# Patient Record
Sex: Female | Born: 1975 | Race: White | Hispanic: No | Smoking: Never smoker
Health system: Southern US, Community
[De-identification: ages and names within clinical notes are randomized; demographics above are authoritative.]

## PROBLEM LIST (undated history)

## (undated) DIAGNOSIS — E785 Hyperlipidemia, unspecified: Secondary | ICD-10-CM

## (undated) DIAGNOSIS — N390 Urinary tract infection, site not specified: Secondary | ICD-10-CM

## (undated) DIAGNOSIS — I675 Moyamoya disease: Secondary | ICD-10-CM

## (undated) DIAGNOSIS — F32A Depression, unspecified: Secondary | ICD-10-CM

## (undated) DIAGNOSIS — F329 Major depressive disorder, single episode, unspecified: Secondary | ICD-10-CM

## (undated) DIAGNOSIS — I6529 Occlusion and stenosis of unspecified carotid artery: Secondary | ICD-10-CM

## (undated) DIAGNOSIS — I639 Cerebral infarction, unspecified: Secondary | ICD-10-CM

## (undated) DIAGNOSIS — N9489 Other specified conditions associated with female genital organs and menstrual cycle: Principal | ICD-10-CM

## (undated) DIAGNOSIS — F419 Anxiety disorder, unspecified: Secondary | ICD-10-CM

## (undated) HISTORY — DX: Depression, unspecified: F32.A

## (undated) HISTORY — DX: Hyperlipidemia, unspecified: E78.5

## (undated) HISTORY — DX: Other specified conditions associated with female genital organs and menstrual cycle: N94.89

## (undated) HISTORY — DX: Urinary tract infection, site not specified: N39.0

## (undated) HISTORY — DX: Moyamoya disease: I67.5

## (undated) HISTORY — DX: Cerebral infarction, unspecified: I63.9

## (undated) HISTORY — DX: Anxiety disorder, unspecified: F41.9

## (undated) HISTORY — DX: Occlusion and stenosis of unspecified carotid artery: I65.29

## (undated) HISTORY — DX: Major depressive disorder, single episode, unspecified: F32.9

---

## 2000-10-12 ENCOUNTER — Encounter: Payer: Self-pay | Admitting: Emergency Medicine

## 2000-10-12 ENCOUNTER — Emergency Department (HOSPITAL_COMMUNITY): Admission: EM | Admit: 2000-10-12 | Discharge: 2000-10-12 | Payer: Self-pay | Admitting: Emergency Medicine

## 2001-02-27 ENCOUNTER — Other Ambulatory Visit: Admission: RE | Admit: 2001-02-27 | Discharge: 2001-02-27 | Payer: Self-pay | Admitting: *Deleted

## 2002-08-05 ENCOUNTER — Ambulatory Visit (HOSPITAL_COMMUNITY): Admission: RE | Admit: 2002-08-05 | Discharge: 2002-08-05 | Payer: Self-pay | Admitting: *Deleted

## 2002-08-05 ENCOUNTER — Encounter: Payer: Self-pay | Admitting: *Deleted

## 2002-12-31 ENCOUNTER — Inpatient Hospital Stay (HOSPITAL_COMMUNITY): Admission: RE | Admit: 2002-12-31 | Discharge: 2003-01-02 | Payer: Self-pay | Admitting: *Deleted

## 2009-02-05 ENCOUNTER — Ambulatory Visit: Payer: Self-pay | Admitting: Family Medicine

## 2010-08-13 DIAGNOSIS — I639 Cerebral infarction, unspecified: Secondary | ICD-10-CM

## 2010-08-13 HISTORY — DX: Cerebral infarction, unspecified: I63.9

## 2010-10-19 ENCOUNTER — Institutional Professional Consult (permissible substitution): Payer: Self-pay | Admitting: Pulmonary Disease

## 2011-01-01 ENCOUNTER — Inpatient Hospital Stay: Payer: Self-pay | Admitting: Internal Medicine

## 2011-02-28 DIAGNOSIS — I679 Cerebrovascular disease, unspecified: Secondary | ICD-10-CM | POA: Insufficient documentation

## 2011-03-14 HISTORY — PX: BRAIN SURGERY: SHX531

## 2012-03-14 ENCOUNTER — Other Ambulatory Visit: Payer: Self-pay | Admitting: Internal Medicine

## 2012-03-14 NOTE — Telephone Encounter (Signed)
She has not yet been seen in our clinic. Will need to contact previous PCP for refills.

## 2012-03-14 NOTE — Telephone Encounter (Signed)
Patient advised as instructed via telephone, she was really upset.  She stated that her PCP office closed down and she has been to urgent care to get refills but her insurance will not cover them because the refills are from an urgent care.

## 2012-03-14 NOTE — Telephone Encounter (Signed)
Pt called stated that her drug store  [facility] has been calling and faxing for past week  to see if she can get a refill on her meds for the next 21 days until she has her new patient appointment on 8/22  Her pirmary care dr was Krystal Crane who has closed down her practice There were several rx's Pt is competely out of her meds as of today Please advise pt today on what to do

## 2012-04-03 ENCOUNTER — Ambulatory Visit: Payer: Self-pay | Admitting: Internal Medicine

## 2012-04-21 DIAGNOSIS — I69919 Unspecified symptoms and signs involving cognitive functions following unspecified cerebrovascular disease: Secondary | ICD-10-CM | POA: Insufficient documentation

## 2012-09-05 ENCOUNTER — Ambulatory Visit: Payer: Self-pay | Admitting: Bariatrics

## 2012-09-05 LAB — HEPATIC FUNCTION PANEL A (ARMC)
Bilirubin, Direct: <0.1 mg/dL (ref 0.00–0.20)
SGOT(AST): 15 U/L (ref 15–37)
Total Protein: 7.5 g/dL (ref 6.4–8.2)

## 2012-09-05 LAB — PHOSPHORUS: Phosphorus: 3 mg/dL (ref 2.5–4.9)

## 2012-09-05 LAB — HCG, QUANTITATIVE, PREGNANCY: Beta Hcg, Quant.: <1 m[IU]/mL — ABNORMAL LOW

## 2012-09-05 LAB — CBC WITH DIFFERENTIAL/PLATELET
Eosinophil #: 0.2 10*3/uL (ref 0.0–0.7)
Eosinophil %: 2.1 %
Lymphocyte #: 3.5 10*3/uL (ref 1.0–3.6)
Lymphocyte %: 30.5 %
MCHC: 33.5 g/dL (ref 32.0–36.0)
MCV: 86 fL (ref 80–100)
Monocyte %: 7.3 %
Neutrophil %: 59.7 %
RDW: 13.4 % (ref 11.5–14.5)
WBC: 11.4 10*3/uL — ABNORMAL HIGH (ref 3.6–11.0)

## 2012-09-05 LAB — PROTIME-INR: Prothrombin Time: 12.9 secs (ref 11.5–14.7)

## 2012-09-05 LAB — CALCIUM: Calcium, Total: 8.4 mg/dL — ABNORMAL LOW (ref 8.5–10.1)

## 2012-09-08 ENCOUNTER — Ambulatory Visit: Payer: Self-pay | Admitting: Bariatrics

## 2012-09-08 DIAGNOSIS — E782 Mixed hyperlipidemia: Secondary | ICD-10-CM

## 2012-09-10 ENCOUNTER — Ambulatory Visit: Payer: Self-pay

## 2012-09-13 ENCOUNTER — Ambulatory Visit: Payer: Self-pay | Admitting: Bariatrics

## 2012-10-01 ENCOUNTER — Other Ambulatory Visit: Payer: Self-pay | Admitting: Bariatrics

## 2012-10-01 LAB — COMPREHENSIVE METABOLIC PANEL
Albumin: 3.7 g/dL (ref 3.4–5.0)
BUN: 14 mg/dL (ref 7–18)
Bilirubin,Total: 0.4 mg/dL (ref 0.2–1.0)
Calcium, Total: 8.7 mg/dL (ref 8.5–10.1)
Chloride: 107 mmol/L (ref 98–107)
Co2: 25 mmol/L (ref 21–32)
Glucose: 90 mg/dL (ref 65–99)
Osmolality: 279 (ref 275–301)
Potassium: 4.1 mmol/L (ref 3.5–5.1)
SGOT(AST): 16 U/L (ref 15–37)
SGPT (ALT): 28 U/L (ref 12–78)
Sodium: 140 mmol/L (ref 136–145)

## 2012-10-01 LAB — IRON AND TIBC
Iron Bind.Cap.(Total): 309 ug/dL (ref 250–450)
Iron Saturation: 20 %

## 2012-10-01 LAB — FERRITIN: Ferritin (ARMC): 27 ng/mL (ref 8–388)

## 2012-12-30 HISTORY — PX: BARIATRIC SURGERY: SHX1103

## 2013-08-13 HISTORY — PX: CHOLECYSTECTOMY: SHX55

## 2014-01-06 ENCOUNTER — Ambulatory Visit: Payer: Self-pay | Admitting: Family Medicine

## 2014-01-25 DIAGNOSIS — K8020 Calculus of gallbladder without cholecystitis without obstruction: Secondary | ICD-10-CM | POA: Insufficient documentation

## 2014-02-27 ENCOUNTER — Emergency Department: Payer: Self-pay | Admitting: Emergency Medicine

## 2014-02-27 LAB — CBC
HCT: 39.8 % (ref 35.0–47.0)
HGB: 13.3 g/dL (ref 12.0–16.0)
MCH: 29.8 pg (ref 26.0–34.0)
MCHC: 33.4 g/dL (ref 32.0–36.0)
MCV: 89 fL (ref 80–100)
Platelet: 208 10*3/uL (ref 150–440)
RBC: 4.46 10*6/uL (ref 3.80–5.20)
RDW: 12.9 % (ref 11.5–14.5)
WBC: 8.2 10*3/uL (ref 3.6–11.0)

## 2014-02-27 LAB — COMPREHENSIVE METABOLIC PANEL
ALBUMIN: 3.6 g/dL (ref 3.4–5.0)
ALK PHOS: 95 U/L
ANION GAP: 6 — AB (ref 7–16)
AST: 20 U/L (ref 15–37)
BUN: 10 mg/dL (ref 7–18)
Bilirubin,Total: 0.6 mg/dL (ref 0.2–1.0)
CALCIUM: 8.2 mg/dL — AB (ref 8.5–10.1)
Chloride: 107 mmol/L (ref 98–107)
Co2: 28 mmol/L (ref 21–32)
Creatinine: 0.67 mg/dL (ref 0.60–1.30)
GLUCOSE: 75 mg/dL (ref 65–99)
Osmolality: 279 (ref 275–301)
Potassium: 3.5 mmol/L (ref 3.5–5.1)
SGPT (ALT): 21 U/L (ref 12–78)
Sodium: 141 mmol/L (ref 136–145)
TOTAL PROTEIN: 7.1 g/dL (ref 6.4–8.2)

## 2014-02-27 LAB — TROPONIN I: Troponin-I: <0.02 ng/mL

## 2014-03-03 ENCOUNTER — Emergency Department: Payer: Self-pay | Admitting: Internal Medicine

## 2014-03-03 LAB — COMPREHENSIVE METABOLIC PANEL
ANION GAP: 6 — AB (ref 7–16)
Albumin: 3.7 g/dL (ref 3.4–5.0)
Alkaline Phosphatase: 101 U/L
BUN: 9 mg/dL (ref 7–18)
Bilirubin,Total: 0.4 mg/dL (ref 0.2–1.0)
CO2: 29 mmol/L (ref 21–32)
CREATININE: 0.76 mg/dL (ref 0.60–1.30)
Calcium, Total: 8.4 mg/dL — ABNORMAL LOW (ref 8.5–10.1)
Chloride: 108 mmol/L — ABNORMAL HIGH (ref 98–107)
EGFR (African American): >60
EGFR (Non-African Amer.): >60
Glucose: 86 mg/dL (ref 65–99)
Osmolality: 283 (ref 275–301)
POTASSIUM: 3.7 mmol/L (ref 3.5–5.1)
SGOT(AST): 22 U/L (ref 15–37)
SGPT (ALT): 21 U/L
Sodium: 143 mmol/L (ref 136–145)
Total Protein: 7.2 g/dL (ref 6.4–8.2)

## 2014-03-03 LAB — CBC WITH DIFFERENTIAL/PLATELET
BASOS ABS: 0 10*3/uL (ref 0.0–0.1)
BASOS PCT: 0.5 %
EOS ABS: 0.2 10*3/uL (ref 0.0–0.7)
Eosinophil %: 2.1 %
HCT: 39.4 % (ref 35.0–47.0)
HGB: 13.3 g/dL (ref 12.0–16.0)
LYMPHS ABS: 2.4 10*3/uL (ref 1.0–3.6)
Lymphocyte %: 26.7 %
MCH: 30.1 pg (ref 26.0–34.0)
MCHC: 33.9 g/dL (ref 32.0–36.0)
MCV: 89 fL (ref 80–100)
Monocyte #: 0.6 x10 3/mm (ref 0.2–0.9)
Monocyte %: 7 %
Neutrophil #: 5.7 10*3/uL (ref 1.4–6.5)
Neutrophil %: 63.7 %
Platelet: 233 10*3/uL (ref 150–440)
RBC: 4.43 10*6/uL (ref 3.80–5.20)
RDW: 13.1 % (ref 11.5–14.5)
WBC: 8.9 10*3/uL (ref 3.6–11.0)

## 2014-03-03 LAB — URINALYSIS, COMPLETE
BACTERIA: NONE SEEN
Bilirubin,UR: NEGATIVE
Glucose,UR: NEGATIVE mg/dL (ref 0–75)
Ketone: NEGATIVE
Nitrite: NEGATIVE
PH: 5 (ref 4.5–8.0)
Protein: NEGATIVE
RBC,UR: 1 /HPF (ref 0–5)
SPECIFIC GRAVITY: 1.018 (ref 1.003–1.030)
Squamous Epithelial: 1

## 2014-03-03 LAB — PREGNANCY, URINE: PREGNANCY TEST, URINE: NEGATIVE m[IU]/mL

## 2014-04-06 ENCOUNTER — Telehealth: Payer: Self-pay | Admitting: *Deleted

## 2014-04-06 NOTE — Telephone Encounter (Addendum)
Pt called and is asking for second opinion with our office.  Had stroke 3 yrs ago, had brain surgery MCA x 2 and having bad headaches.

## 2014-07-30 NOTE — Telephone Encounter (Signed)
Patient will follow the referral process to be seen in our office.

## 2015-01-24 ENCOUNTER — Other Ambulatory Visit: Payer: Self-pay

## 2015-01-24 ENCOUNTER — Telehealth: Payer: Self-pay | Admitting: Family Medicine

## 2015-01-24 DIAGNOSIS — F32A Depression, unspecified: Secondary | ICD-10-CM

## 2015-01-24 DIAGNOSIS — F329 Major depressive disorder, single episode, unspecified: Secondary | ICD-10-CM

## 2015-01-24 DIAGNOSIS — F419 Anxiety disorder, unspecified: Principal | ICD-10-CM

## 2015-01-24 MED ORDER — VENLAFAXINE HCL ER 75 MG PO CP24: 225.0000 mg | ORAL_CAPSULE | Freq: Every day | ORAL | Status: DC

## 2015-01-24 NOTE — Telephone Encounter (Signed)
Lmom to inform pt about her refill

## 2015-01-24 NOTE — Telephone Encounter (Signed)
Received a fax from [facility] requesting that the quantitiy be changed to a 90 day supply so that insurance would cover it.

## 2015-01-24 NOTE — Telephone Encounter (Signed)
Done

## 2015-01-25 ENCOUNTER — Other Ambulatory Visit: Payer: Self-pay

## 2015-01-25 DIAGNOSIS — F329 Major depressive disorder, single episode, unspecified: Secondary | ICD-10-CM

## 2015-01-25 DIAGNOSIS — F419 Anxiety disorder, unspecified: Principal | ICD-10-CM

## 2015-01-25 DIAGNOSIS — F32A Depression, unspecified: Secondary | ICD-10-CM

## 2015-01-26 MED ORDER — VENLAFAXINE HCL ER 75 MG PO CP24: 225.0000 mg | ORAL_CAPSULE | Freq: Every day | ORAL | Status: DC

## 2015-04-15 ENCOUNTER — Ambulatory Visit: Payer: Self-pay | Admitting: Family Medicine

## 2015-04-20 ENCOUNTER — Other Ambulatory Visit: Payer: Self-pay | Admitting: Family Medicine

## 2015-04-20 ENCOUNTER — Encounter: Payer: Self-pay | Admitting: Family Medicine

## 2015-04-20 ENCOUNTER — Ambulatory Visit (INDEPENDENT_AMBULATORY_CARE_PROVIDER_SITE_OTHER): Payer: BLUE CROSS/BLUE SHIELD | Admitting: Family Medicine

## 2015-04-20 VITALS — BP 104/60 | HR 121 | Temp 97.6°F | Resp 18 | Ht 63.0 in | Wt 152.7 lb

## 2015-04-20 DIAGNOSIS — N390 Urinary tract infection, site not specified: Secondary | ICD-10-CM

## 2015-04-20 DIAGNOSIS — F3341 Major depressive disorder, recurrent, in partial remission: Secondary | ICD-10-CM | POA: Insufficient documentation

## 2015-04-20 DIAGNOSIS — R2232 Localized swelling, mass and lump, left upper limb: Secondary | ICD-10-CM | POA: Diagnosis not present

## 2015-04-20 DIAGNOSIS — R319 Hematuria, unspecified: Secondary | ICD-10-CM

## 2015-04-20 DIAGNOSIS — G44209 Tension-type headache, unspecified, not intractable: Secondary | ICD-10-CM | POA: Insufficient documentation

## 2015-04-20 DIAGNOSIS — I639 Cerebral infarction, unspecified: Secondary | ICD-10-CM | POA: Insufficient documentation

## 2015-04-20 DIAGNOSIS — F418 Other specified anxiety disorders: Secondary | ICD-10-CM

## 2015-04-20 DIAGNOSIS — K8020 Calculus of gallbladder without cholecystitis without obstruction: Secondary | ICD-10-CM | POA: Insufficient documentation

## 2015-04-20 DIAGNOSIS — Z87898 Personal history of other specified conditions: Secondary | ICD-10-CM | POA: Insufficient documentation

## 2015-04-20 DIAGNOSIS — R109 Unspecified abdominal pain: Secondary | ICD-10-CM | POA: Insufficient documentation

## 2015-04-20 DIAGNOSIS — Z789 Other specified health status: Secondary | ICD-10-CM | POA: Insufficient documentation

## 2015-04-20 DIAGNOSIS — I651 Occlusion and stenosis of basilar artery: Secondary | ICD-10-CM | POA: Insufficient documentation

## 2015-04-20 DIAGNOSIS — E785 Hyperlipidemia, unspecified: Secondary | ICD-10-CM | POA: Insufficient documentation

## 2015-04-20 DIAGNOSIS — R066 Hiccough: Secondary | ICD-10-CM | POA: Insufficient documentation

## 2015-04-20 LAB — POCT URINALYSIS DIPSTICK
Bilirubin, UA: NEGATIVE
GLUCOSE UA: NEGATIVE
Ketones, UA: NEGATIVE
NITRITE UA: POSITIVE
PROTEIN UA: NEGATIVE
Spec Grav, UA: 1.02
UROBILINOGEN UA: 0.2
pH, UA: 6.5

## 2015-04-20 MED ORDER — CIPROFLOXACIN HCL 500 MG PO TABS: 500.0000 mg | ORAL_TABLET | Freq: Two times a day (BID) | ORAL | Status: DC

## 2015-04-20 NOTE — Patient Instructions (Signed)

## 2015-04-20 NOTE — Addendum Note (Signed)
Addended by: Edwena Felty on: 04/20/2015 01:22 PM   Modules accepted: Kipp Brood

## 2015-04-20 NOTE — Progress Notes (Signed)
Name: Krystal Crane   MRN: [id number]    DOB: October 26, 1975   Date:04/20/2015       Progress Note  Subjective  Chief Complaint  Chief Complaint  Patient presents with  . Urinary Tract Infection    patient stated that her symptoms started about 2 weeks ago.  . Finger Injury    patient had a knot on her left ring finger that is now swelling, but no pain.    HPI  Patient is here today with concerns regarding the following symptoms abnormal smelling urine, foul smelling urine, frequency, hesitancy and incomplete bladder emptying that started 2 weeks ago after having sexual intercourse with husband.  Associated with no other symptoms.   No problem-specific assessment & plan notes found for this encounter.   History reviewed. No pertinent past medical history.  Social History  Substance Use Topics  . Smoking status: Never Smoker   . Smokeless tobacco: Not on file  . Alcohol Use: 0.0 oz/week    0 Standard drinks or equivalent per week     Comment: social     Current outpatient prescriptions:  .  aspirin EC 325 MG tablet, Take 325 mg by mouth., Disp: , Rfl:  .  FLUoxetine (PROZAC) 40 MG capsule, Take 2 capsules each day, Disp: , Rfl:  .  venlafaxine (EFFEXOR) 100 MG tablet, Take 300 mg by mouth., Disp: , Rfl:  .  buPROPion (WELLBUTRIN SR) 150 MG 12 hr tablet, Take 150 mg by mouth 2 (two) times daily., Disp: , Rfl:  .  clonazePAM (KLONOPIN) 0.5 MG tablet, Take 0.5 mg by mouth 2 (two) times daily as needed., Disp: , Rfl:  .  rosuvastatin (CRESTOR) 20 MG tablet, Take 20 mg by mouth daily., Disp: , Rfl:  .  Topiramate ER (QUDEXY XR) 150 MG CS24, Take by mouth., Disp: , Rfl:  .  venlafaxine XR (EFFEXOR-XR) 75 MG 24 hr capsule, Take 3 capsules (225 mg total) by mouth daily with breakfast., Disp: 270 capsule, Rfl: 1  Allergies  Allergen Reactions  . Levetiracetam Hives    ROS  10 Systems reviewed and is negative except as mentioned in HPI.   Objective  Filed Vitals:   04/20/15  1138  BP: 104/60  Pulse: 121  Temp: 97.6 F (36.4 C)  TempSrc: Oral  Resp: 18  Height:  (1.6 m)  Weight: 152 lb 11.2 oz (69.264 kg)  SpO2: 97%   Body mass index is 27.06 kg/(m^2).   Physical Exam  Constitutional: Patient appears well-developed and well-nourished. In no acute distress but does appear to be uncomfortable from acute illness. Cardiovascular: Normal rate, regular rhythm and normal heart sounds.  No murmur heard.  Pulmonary/Chest: Effort normal and breath sounds normal. No respiratory distress. Abdomen: Soft with normal bowel sounds, mild tenderness on deep palpation over suprapubic area, no reproducible flank tenderness bilaterally.  Genitourinary: Exam deferred. Skin: Skin is warm and dry. No rash noted. No erythema.  Psychiatric: Patient has a normal mood and affect. Behavior is normal in office today. Judgment and thought content normal in office today.   Assessment & Plan  Symptoms suggestive of uncomplicated urinary tract infection. Instructed patient on increasing hydration with water and ways to prevent future UTIs. May use Azo for symptomatic relief if not already doing so but not recommended to be used beyond 2-3 days. May start antibiotic therapy.   The patient has been counseled on the proper use, side effects and potential interactions of the new medication. Patient  encouraged to review the side effects and safety profile pamphlet provided with the prescription from the pharmacy as well as request counseling from the pharmacy team as needed.

## 2015-04-20 NOTE — Progress Notes (Signed)
Name: Krystal Crane   MRN: [id number]    DOB: October 05, 1975   Date:04/20/2015       Progress Note  Subjective  Chief Complaint  Chief Complaint  Patient presents with  . Urinary Tract Infection    patient stated that her symptoms started about 2 weeks ago.  . Finger Injury    patient had a knot on her left ring finger that is now swelling, but no pain.    HPI  Patient is here today with concerns regarding the following symptoms abnormal smelling urine, burning with urination, dysuria, frequency, hesitancy, suprapubic pressure and urgency that started 2 weeks ago.  Associated with fatigue. Not associated with fevers, nausea, vomiting. Is having some right flank discomfort.  She has not tried anything for relief.  Still has knot on palmar surface of left hand 4th digit which has now increased in size with swelling. Still has full ROM and there is no pain. Had to take her rings off. Denies falling onto hand, foreign object in hand, numbness tingling, other lesions or joint involvement.   Active Ambulatory Problems    Diagnosis Date Noted  . Abdominal pain 04/20/2015  . 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 Anxiety and depression 04/20/2015  . Cholelithiasis without obstruction 04/20/2015  . Calculus of gallbladder 01/25/2014  . Diffuse cerebrovascular disease 02/28/2011  . Cephalalgia 04/21/2012  . HLD (hyperlipidemia) 04/20/2015  . Encounter for screening for lipoid disorders 04/20/2015  . 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 Cerebral vascular accident 04/20/2015  . Headache, tension-type 04/20/2015  . Basilar artery stenosis 04/20/2015  . Urinary tract infectious disease 04/20/2015  . Nodule of finger of left hand 04/20/2015   Resolved Ambulatory Problems    Diagnosis Date Noted  . Gravida 4 para 4 04/20/2015  . Parity 3 04/20/2015   No Additional Past Medical History    Social History  Substance Use Topics  . Smoking status: Never Smoker   .  Smokeless tobacco: Not on file  . Alcohol Use: 0.0 oz/week    0 Standard drinks or equivalent per week     Comment: social     Current outpatient prescriptions:  .  aspirin EC 325 MG tablet, Take 325 mg by mouth., Disp: , Rfl:  .  FLUoxetine (PROZAC) 40 MG capsule, Take 2 capsules each day, Disp: , Rfl:  .  venlafaxine (EFFEXOR) 100 MG tablet, Take 300 mg by mouth., Disp: , Rfl:  .  buPROPion (WELLBUTRIN SR) 150 MG 12 hr tablet, Take 150 mg by mouth 2 (two) times daily., Disp: , Rfl:  .  clonazePAM (KLONOPIN) 0.5 MG tablet, Take 0.5 mg by mouth 2 (two) times daily as needed., Disp: , Rfl:  .  rosuvastatin (CRESTOR) 20 MG tablet, Take 20 mg by mouth daily., Disp: , Rfl:  .  Topiramate ER (QUDEXY XR) 150 MG CS24, Take by mouth., Disp: , Rfl:  .  venlafaxine XR (EFFEXOR-XR) 75 MG 24 hr capsule, Take 3 capsules (225 mg total) by mouth daily with breakfast., Disp: 270 capsule, Rfl: 1  Allergies  Allergen Reactions  . Levetiracetam Hives    ROS  CONSTITUTIONAL: No significant weight changes, fever, chills, weakness or fatigue.  HEENT:  - Eyes: No visual changes.  - Ears: No auditory changes. No pain.  - Nose: No sneezing, congestion, runny nose. - Throat: No sore throat. No changes in swallowing. SKIN: No rash or itching.  CARDIOVASCULAR: No chest pain, chest pressure or chest discomfort.  No palpitations or edema.  RESPIRATORY: No shortness of breath, cough or sputum.  GASTROINTESTINAL: No anorexia, nausea, vomiting. No changes in bowel habits. No abdominal pain or blood.  GENITOURINARY: Yes dysuria, frequency, odor to urine. NEUROLOGICAL: No headache, dizziness, syncope, paralysis, ataxia, numbness or tingling in the extremities. No memory changes. No change in bowel or bladder control.  MUSCULOSKELETAL: No joint pain. No muscle pain. Left hand 4th finger joint swelling. HEMATOLOGIC: No anemia, bleeding or bruising.  LYMPHATICS: No enlarged lymph nodes.  PSYCHIATRIC: No change in  mood. No change in sleep pattern.  ENDOCRINOLOGIC: No reports of sweating, cold or heat intolerance. No polyuria or polydipsia.     Objective  Filed Vitals:   04/20/15 1138  BP: 104/60  Pulse: 121  Temp: 97.6 F (36.4 C)  TempSrc: Oral  Resp: 18  Height: 5\' 3"  (1.6 m)  Weight: 152 lb 11.2 oz (69.264 kg)  SpO2: 97%   Body mass index is 27.06 kg/(m^2).   Results for orders placed or performed in visit on 04/20/15 (from the past 24 hour(s))  POCT urinalysis dipstick     Status: Abnormal   Collection Time: 04/20/15 11:53 AM  Result Value Ref Range   Color, UA AMBER    Clarity, UA CLOUDY    Glucose, UA NEGATIVE    Bilirubin, UA NEGATIVE    Ketones, UA NEGATIVE    Spec Grav, UA 1.020    Blood, UA SMALL    pH, UA 6.5    Protein, UA NEGATIVE    Urobilinogen, UA 0.2    Nitrite, UA POSITIVE    Leukocytes, UA moderate (2+) (A) Negative      Physical Exam  Constitutional: Patient appears well-developed and well-nourished. In no distress.  Cardiovascular: Normal rate, regular rhythm and normal heart sounds.  No murmur heard.  Pulmonary/Chest: Effort normal and breath sounds normal. No respiratory distress. Abdomen: Soft with normal bowel sounds, mild tenderness on deep palpation over suprapubic area, no reproducible flank tenderness bilaterally.  Genitourinary: Exam deferred. Musculoskeletal: Normal range of motion bilateral UE and LE. Left hand 4th digit, palmar surface, swelling over PIP joint, non tender, blue hued nodule palpated under skin. Peripheral vascular: Bilateral LE no edema. Neurological: CN II-XII grossly intact with no focal deficits. Alert and oriented to person, place, and time. Coordination, balance, strength, speech and gait are normal.  Skin: Skin is warm and dry. No rash noted. No erythema.  Psychiatric: Patient has a normal mood and affect. Behavior is normal in office today. Judgment and thought content normal in office today.   Assessment &  Plan  1. Urinary tract infection with hematuria, site unspecified Symptoms suggestive of uncomplicated urinary tract infection. Instructed patient on increasing hydration with water and ways to prevent future UTIs. May use Azo for symptomatic relief if not already doing so but not recommended to be used beyond 2-3 days. May start antibiotic therapy. I will send urine specimen for culture and STD testing.  The patient has been counseled on the proper use, side effects and potential interactions of the new medication. Patient encouraged to review the side effects and safety profile pamphlet provided with the prescription from the pharmacy as well as request counseling from the pharmacy team as needed.   - POCT urinalysis dipstick - GC/chlamydia probe amp, urine - Urine Culture - ciprofloxacin (CIPRO) 500 MG tablet; Take 1 tablet (500 mg total) by mouth 2 (two) times daily.  Dispense: 20 tablet; Refill: 0  2. Nodule of finger of  left hand Progressive swelling will consult ortho.   - Ambulatory referral to Orthopedic Surgery

## 2015-04-22 LAB — URINE CULTURE

## 2015-04-22 LAB — CHLAMYDIA/GONOCOCCUS/TRICHOMONAS, NAA
CHLAMYDIA BY NAA: NEGATIVE
GONOCOCCUS BY NAA: NEGATIVE
TRICH VAG BY NAA: NEGATIVE

## 2015-05-02 ENCOUNTER — Other Ambulatory Visit: Payer: Self-pay | Admitting: Family Medicine

## 2015-05-02 NOTE — Telephone Encounter (Signed)
Her urine culture grew E. Coli sensitive the the antibiotic she was prescribed, ciprofloxacin. You may have to call [facility] about the GC/Chlamydia testing because they never send me results.

## 2015-05-02 NOTE — Telephone Encounter (Signed)
Patient is checking status on her urinalysis results

## 2015-05-02 NOTE — Telephone Encounter (Signed)
Patient was informed of results and she stated she has completed the full course of atb today.

## 2015-05-03 ENCOUNTER — Other Ambulatory Visit: Payer: Self-pay

## 2015-05-03 MED ORDER — CLONAZEPAM 0.5 MG PO TABS: 0.5000 mg | ORAL_TABLET | Freq: Two times a day (BID) | ORAL | Status: DC | PRN

## 2015-05-03 NOTE — Telephone Encounter (Signed)
Refill request was sent to Dr. Ashany Sundaram for approval and submission.  

## 2015-05-09 ENCOUNTER — Telehealth: Payer: Self-pay

## 2015-05-09 NOTE — Telephone Encounter (Signed)
Patient came in to pick up her Rx for Klonopin, but stated that Dr. Sherley Bounds did not give her the right quantity. She stated that she takes it twice a everday and that #30 was not going to be enough. Patient was told and shown that it was written on the rx stating "NOT INTENDED TO BE USED EVERY DAY," but she stated it was never discussed about her coming down or off of it and she can't. She stated that she has been w/o her meds for 3 days now and could not get this one filled b/c if she did her insurance would not cover the right rx so she will hold off until I hear back from Dr. Sherley Bounds. I informed her that I would send her a message and would give her a call back.

## 2015-05-10 ENCOUNTER — Other Ambulatory Visit: Payer: Self-pay | Admitting: Family Medicine

## 2015-05-10 DIAGNOSIS — F1320 Sedative, hypnotic or anxiolytic dependence, uncomplicated: Secondary | ICD-10-CM

## 2015-05-10 MED ORDER — CLONAZEPAM 0.5 MG PO TABS: 0.5000 mg | ORAL_TABLET | Freq: Two times a day (BID) | ORAL | Status: DC

## 2015-05-10 NOTE — Telephone Encounter (Signed)
Printed new Rx quantity #60 for 30 day supply AND added new diagnosis to her medical records: Benzodiazepine Dependency.

## 2015-07-18 ENCOUNTER — Ambulatory Visit (INDEPENDENT_AMBULATORY_CARE_PROVIDER_SITE_OTHER): Payer: BLUE CROSS/BLUE SHIELD | Admitting: Family Medicine

## 2015-07-18 ENCOUNTER — Encounter: Payer: Self-pay | Admitting: Family Medicine

## 2015-07-18 VITALS — BP 102/68 | HR 109 | Temp 97.6°F | Resp 16 | Wt 163.2 lb

## 2015-07-18 DIAGNOSIS — F418 Other specified anxiety disorders: Principal | ICD-10-CM

## 2015-07-18 DIAGNOSIS — F3341 Major depressive disorder, recurrent, in partial remission: Secondary | ICD-10-CM

## 2015-07-18 DIAGNOSIS — I675 Moyamoya disease: Secondary | ICD-10-CM | POA: Diagnosis not present

## 2015-07-18 NOTE — Progress Notes (Signed)
Name: Krystal Crane   MRN: [id number]    DOB: 04-24-1976   Date:07/18/2015       Progress Note  Subjective  Chief Complaint  Chief Complaint  Patient presents with  . Medication Management    patient is not sure if her medication needs to be changed or if she needs to be referred to a counsellor.    HPI  Krystal Crane is a 39 year old female here today to discuss her long standing depression and anxiety. Accompanied by husband today. Pertinent history of Moya-Moya disease, with cognitive memory issues. Follow by [facility], although provider for Neurology will change soon she reports. Patient voices poor sleep, ongoing headache issues despite going to headache specialist for nearly a year now. Tried establishing with psychiatrist earlier this year but was late to her appointment and they would not reschedule her. Husband states she has poor sleep, 3-4 hrs a night, fatigued during the day, unmotivated, not helping around the house or with kids. He has come to her visit today as he is concerned about [name] and reports much strain on her marriage due to her depression.   If you may recall [name] has tried Prozac  for 15 years but efficacy wore off so they switched to Brintelix  which initially worked but then effects wore off so dose increased to  dose and it gave her more headaches. Also having sexual side effects to Brintelix. Switched to Venlafaxine with daily use of clonazepam and added on Wellbutrin.   In addition headache specialists have added Topiramate, hydroxyzine, cambia, lyrica, phenergan, reglan.   Patient Active Problem List   Diagnosis Date Noted  . 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 Benzodiazepine dependence, continuous (HCC) 05/10/2015  . Major depressive disorder, recurrent episode, in partial remission with anxious distress (HCC) 04/20/2015  . Cholelithiasis without obstruction 04/20/2015  . HLD (hyperlipidemia) 04/20/2015  .  Encounter for screening for lipoid disorders 04/20/2015  . 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 Cerebral vascular accident (HCC) 04/20/2015  . Headache, tension-type 04/20/2015  . Basilar artery stenosis 04/20/2015  . Nodule of finger of left hand 04/20/2015  . Calculus of gallbladder 01/25/2014  . 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 Cephalalgia 04/21/2012  . Diffuse cerebrovascular disease 02/28/2011    Social History  Substance Use Topics  . Smoking status: Never Smoker   . Smokeless tobacco: Not on file  . Alcohol Use: 0.0 oz/week    0 Standard drinks or equivalent per week     Comment: social     Current outpatient prescriptions:  .  aspirin EC 325 MG tablet, Take 325 mg by mouth., Disp: , Rfl:  .  buPROPion (WELLBUTRIN SR) 150 MG 12 hr tablet, Take 150 mg by mouth 2 (two) times daily., Disp: , Rfl:  .  chlorzoxazone (PARAFON) 500 MG tablet, Take 1 tablet by mouth 4 (four) times daily as needed., Disp: , Rfl:  .  clonazePAM (KLONOPIN) 0.5 MG tablet, Take 1 tablet (0.5 mg total) by mouth 2 (two) times daily., Disp: 60 tablet, Rfl: 5 .  Diclofenac Potassium (CAMBIA) 50 MG PACK, , Disp: , Rfl:  .  hydrOXYzine (ATARAX/VISTARIL) 10 MG tablet, Take 10 mg by mouth 3 (three) times daily as needed., Disp: , Rfl:  .  metoCLOPramide (REGLAN) 10 MG tablet, Take 10 mg by mouth 4 (four) times daily., Disp: , Rfl:  .  pregabalin (LYRICA) 50 MG capsule, Take 50 mg by mouth  3 (three) times daily. Work up to 6 tabs a day, Disp: , Rfl:  .  promethazine (PHENERGAN) 25 MG tablet, Take 25 mg by mouth every 6 (six) hours as needed., Disp: , Rfl:  .  Topiramate ER (QUDEXY XR) 150 MG CS24, Take by mouth., Disp: , Rfl:  .  venlafaxine XR (EFFEXOR-XR) 75 MG 24 hr capsule, TAKE 3 CAPSULES BY MOUTH DAILY WITH BREAKFAST, Disp: , Rfl: 1  Past Surgical History  Procedure Laterality Date  . Cesarean section      3  . Brain surgery      bypass surgery    Family History   Problem Relation Age of Onset  . Hypertension Mother   . Stroke Mother   . Hyperlipidemia Father   . Hypertension Father     Allergies  Allergen Reactions  . Levetiracetam Hives  . Ondansetron Nausea And Vomiting     Review of Systems  CONSTITUTIONAL: No significant weight changes, fever, chills, weakness or fatigue.  CARDIOVASCULAR: No chest pain, chest pressure or chest discomfort. No palpitations or edema.  RESPIRATORY: No shortness of breath, cough or sputum.  NEUROLOGICAL: No headache, dizziness, syncope, paralysis, ataxia, numbness or tingling in the extremities. No memory changes. No change in bowel or bladder control.  PSYCHIATRIC: Yes change in mood. No change in sleep pattern.  ENDOCRINOLOGIC: No reports of sweating, cold or heat intolerance. No polyuria or polydipsia.    Depression screen [facility] 07/18/2015 04/20/2015  Decreased Interest 0 0  Down, Depressed, Hopeless 2 1  PHQ - 2 Score 2 1  Altered sleeping 3 -  Tired, decreased energy 3 -  Change in appetite 1 -  Feeling bad or failure about yourself  1 -  Trouble concentrating 2 -  Moving slowly or fidgety/restless 1 -  Suicidal thoughts 0 -  PHQ-9 Score 13 -     Objective  BP 102/68 mmHg  Pulse 109  Temp(Src) 97.6 F (36.4 C) (Oral)  Resp 16  Wt 163 lb 3.2 oz (74.027 kg)  SpO2 97%  LMP 07/06/2015 (Approximate) Body mass index is 28.92 kg/(m^2).  Physical Exam  Constitutional: Patient appears well-developed and well-nourished. In no distress.  Cardiovascular: Normal rate, regular rhythm and normal heart sounds.  No murmur heard.  Pulmonary/Chest: Effort normal and breath sounds normal. No respiratory distress. Musculoskeletal: Normal range of motion bilateral UE and LE, no joint effusions. Peripheral vascular: Bilateral LE no edema. Neurological: CN II-XII grossly intact with no focal deficits. Alert and oriented to person, place, and time. Coordination, balance, strength, speech and gait are  normal.  Skin: Skin is warm and dry. No rash noted. No erythema.  Psychiatric: Patient has a sad mood and affect. Behavior is normal in office today. Judgment and thought content normal in office today.   Assessment & Plan  1. Major depressive disorder, recurrent episode, in partial remission with anxious distress (HCC) Sub optimal control. I would like for Nicholaus BloomKelley to consult with a psychologist and recommend counseling, individual and marriage.   - Ambulatory referral to Psychiatry  2. Moya-moya disease May play a role in depressive moods.  - Ambulatory referral to Psychiatry

## 2015-07-20 ENCOUNTER — Telehealth: Payer: Self-pay | Admitting: Family Medicine

## 2015-07-20 NOTE — Telephone Encounter (Signed)
Patient has been referred to Dr. Lynett FishSu Hansen and has been approved for 6 visits, but I do not know if he has experience with brain injuries.

## 2015-07-20 NOTE — Telephone Encounter (Signed)
Pt is waiting on a referral to psychiatry. Pt is requesting that she be referred to a psychiatrist that has experience with brain injuries.

## 2015-07-20 NOTE — Telephone Encounter (Signed)
Keep appointment with Dr. Lynett FishSu Hansen. Although there may be a correlation to her previous brain injury, depression is treated the same way regardless.

## 2015-07-20 NOTE — Telephone Encounter (Signed)
Contacted this patient to inform her that [facility] to address all of her needs, but there was no answer. A message was left for her to give us a call if she had any additional needs.

## 2015-07-29 ENCOUNTER — Other Ambulatory Visit: Payer: Self-pay | Admitting: Family Medicine

## 2015-07-30 LAB — HM MAMMOGRAPHY: HM MAMMO: NORMAL (ref 0–4)

## 2015-08-18 ENCOUNTER — Telehealth: Payer: Self-pay

## 2015-08-24 NOTE — Telephone Encounter (Signed)
erro  neous encounter

## 2015-09-08 ENCOUNTER — Other Ambulatory Visit: Payer: Self-pay | Admitting: Family Medicine

## 2015-12-02 ENCOUNTER — Other Ambulatory Visit: Payer: Self-pay

## 2015-12-02 MED ORDER — CLONAZEPAM 0.5 MG PO TABS: 0.5000 mg | ORAL_TABLET | Freq: Two times a day (BID) | ORAL | Status: DC

## 2015-12-02 NOTE — Telephone Encounter (Signed)
Is currently not seeing psych.

## 2015-12-02 NOTE — Telephone Encounter (Signed)
Patient would need a visit to discuss this medicine and get Rx I see that Dr. Sherley BoundsSundaram referred her to Dr. Janeece RiggersSu anyway; please ask her to get this medicine from her psychiatrist

## 2015-12-02 NOTE — Telephone Encounter (Signed)
Limited rx ready to fax; will see her 4/26

## 2015-12-02 NOTE — Telephone Encounter (Signed)
Made an appt for next week 4/26 wants to see about going ahead and getting refill

## 2015-12-06 ENCOUNTER — Other Ambulatory Visit: Payer: Self-pay

## 2015-12-07 ENCOUNTER — Ambulatory Visit: Payer: BLUE CROSS/BLUE SHIELD | Admitting: Family Medicine

## 2015-12-09 ENCOUNTER — Encounter: Payer: Self-pay | Admitting: Family Medicine

## 2015-12-09 ENCOUNTER — Ambulatory Visit (INDEPENDENT_AMBULATORY_CARE_PROVIDER_SITE_OTHER): Payer: BLUE CROSS/BLUE SHIELD | Admitting: Family Medicine

## 2015-12-09 VITALS — BP 104/68 | HR 88 | Temp 97.9°F | Resp 14 | Ht 63.0 in | Wt 163.0 lb

## 2015-12-09 DIAGNOSIS — R8290 Unspecified abnormal findings in urine: Secondary | ICD-10-CM

## 2015-12-09 DIAGNOSIS — F418 Other specified anxiety disorders: Secondary | ICD-10-CM

## 2015-12-09 DIAGNOSIS — F1320 Sedative, hypnotic or anxiolytic dependence, uncomplicated: Secondary | ICD-10-CM

## 2015-12-09 DIAGNOSIS — N390 Urinary tract infection, site not specified: Secondary | ICD-10-CM

## 2015-12-09 DIAGNOSIS — F3341 Major depressive disorder, recurrent, in partial remission: Secondary | ICD-10-CM

## 2015-12-09 HISTORY — DX: Urinary tract infection, site not specified: N39.0

## 2015-12-09 LAB — POCT URINALYSIS DIPSTICK
Bilirubin, UA: NEGATIVE
Blood, UA: NEGATIVE
GLUCOSE UA: NEGATIVE
KETONES UA: NEGATIVE
NITRITE UA: POSITIVE
Protein, UA: NEGATIVE
SPEC GRAV UA: 1.02
Urobilinogen, UA: 0.2
pH, UA: 6

## 2015-12-09 MED ORDER — CLONAZEPAM 0.5 MG PO TABS: ORAL_TABLET | ORAL | Status: DC

## 2015-12-09 MED ORDER — NITROFURANTOIN MONOHYD MACRO 100 MG PO CAPS: 100.0000 mg | ORAL_CAPSULE | Freq: Two times a day (BID) | ORAL | Status: DC

## 2015-12-09 NOTE — Patient Instructions (Signed)
Consider drinking cranberry juice or taking a cranberry pill daily for prevention Hydration is important and voiding after intimacy Start the antibiotic today We'll see what the culture shows and get you an antibiotic to take after intimacy Never ever take any alcohol or pain medicine or sleeping pills with the clonazepam Taper that by one-half of a pill each week until you come off Consider seeing a counselor

## 2015-12-09 NOTE — Progress Notes (Signed)
BP 104/68 mmHg  Pulse 88  Temp(Src) 97.9 F (36.6 C) (Oral)  Resp 14  Ht 5\' 3"  (1.6 m)  Wt 163 lb (73.936 kg)  BMI 28.88 kg/m2  SpO2 95%  LMP 11/21/2015 (Approximate)   Subjective:    Patient ID: Krystal Crane, female    DOB: 07-Mar-1976, 40 y.o.   MRN: [id number]  HPI: Krystal Crane is a 40 y.o. female  Chief Complaint  Patient presents with  . Medication Refill  . Urinary Tract Infection    foul odor from urine   Patient thinks she has a urinary tract infection; urine smells foul; gets infections nearly every single time she has had intercourse over the last 5-6 times; never had UTI's before the last year or so, actually 7 months ago; reviewed urine and it was positive; today's urine reviewed; no fevers; does have some back pain, more so lately and taking aleve; lower abdominal pain; periods are regular; no fam hx of frequent UTIs; has IUD in place, will [facility]   Migraines; on lyrica and qudexy; chlorzoxazone PRN, reglan PRN for migraines; getting them pretty much daily; has not botox injections; [facility] [place]  Aspirin daily  effexor 225 mg; clonazepam daily, but has been out for 9 days; did not have any seizures, but did have nervous twitches; uses for anxiety; also takes wellbutrin to counter-act sexual side effects of the effexor; mother has stage 4 liver disease, terminally ill and just hanging on day to day  [facility] 12/09/2015 07/18/2015 04/20/2015  Decreased Interest 0 0 0  Down, Depressed, Hopeless 0 2 1  PHQ - 2 Score 0 2 1  Altered sleeping - 3 -  Tired, decreased energy - 3 -  Change in appetite - 1 -  Feeling bad or failure about yourself  - 1 -  Trouble concentrating - 2 -  Moving slowly or fidgety/restless - 1 -  Suicidal thoughts - 0 -  PHQ-9 Score - 13 -   Relevant past medical, surgical, family and social history reviewed and updated as indicated. Interim  medical history since last visit reviewed. Allergies and medications reviewed and updated.  Review of Systems Per HPI unless specifically indicated above     Objective:    BP 104/68 mmHg  Pulse 88  Temp(Src) 97.9 F (36.6 C) (Oral)  Resp 14  Ht 5\' 3"  (1.6 m)  Wt 163 lb (73.936 kg)  BMI 28.88 kg/m2  SpO2 95%  LMP 11/21/2015 (Approximate)  Wt Readings from Last 3 Encounters:  12/09/15 163 lb (73.936 kg)  07/18/15 163 lb 3.2 oz (74.027 kg)  04/20/15 152 lb 11.2 oz (69.264 kg)    Physical Exam  Constitutional: She appears well-developed and well-nourished. No distress.  Eyes: No scleral icterus.  Cardiovascular: Normal rate and regular rhythm.   Pulmonary/Chest: Effort normal and breath sounds normal.  Abdominal: She exhibits no distension. There is no tenderness. There is no guarding and no CVA tenderness.  Neurological: She is alert.  Skin: No pallor.  Psychiatric: She has a normal mood and affect.   Results for orders placed or performed in visit on 12/09/15  POCT urinalysis dipstick  Result Value Ref Range   Color, UA drk yellow    Clarity, UA clear    Glucose, UA neg    Bilirubin, UA neg    Ketones, UA neg    Spec Grav, UA  1.020    Blood, UA neg    pH, UA 6.0    Protein, UA neg    Urobilinogen, UA 0.2    Nitrite, UA positive    Leukocytes, UA large (3+) (A) Negative      Assessment & Plan:   Problem List Items Addressed This Visit      Genitourinary   Recurrent urinary tract infection    Cranberry as preventive measure; start macrobid today; culture pending; void after intimacy; will check culture results and then prescribe prophylactic antibiotic to take after intimacy; if recurring even then, will refer to urologist      Relevant Medications   nitrofurantoin, macrocrystal-monohydrate, (MACROBID) 100 MG capsule   Other Relevant Orders   Urine culture     Other   Major depressive disorder, recurrent episode, in partial remission with anxious distress  (HCC)    Continue venlafaxine (cautioned to NEVER stop this abruptly) and wellbutrin; will wean her off of clonazepam by 0.25 mg each week; encouraged her to start counseling; list of counselors in the area given to patient      Benzodiazepine dependence, continuous (HCC)    Will wean patient down by 0.25 mg each week to off; Rx printed and given to patient; advised never mix with alcohol, pain pills, sleeping pills       Other Visit Diagnoses    Abnormal urine odor    -  Primary    UTI today; reviewed urine dip; culture ordered; to ER over weekend if getting worse    Relevant Orders    POCT urinalysis dipstick (Completed)    Urine culture       Follow up plan: Return in about 6 months (around 06/09/2016) for regular follow-up.  An after-visit summary was printed and given to the patient at check-out.  Please see the patient instructions which may contain other information and recommendations beyond what is mentioned above in the assessment and plan.  Meds ordered this encounter  Medications  . QUDEXY XR 200 MG CS24    Sig: Take 1 capsule by mouth at bedtime.    Refill:  3  . clonazePAM (KLONOPIN) 0.5 MG tablet    Sig: One and one-half pills (0.75 mg) every AM x 1 week, then one pill (0.5 mg) every AM x 1 week, then half of a pill (0.25 mg) every AM x 1 week, then stop    Dispense:  21 tablet    Refill:  0    Fax to [phone]  . nitrofurantoin, macrocrystal-monohydrate, (MACROBID) 100 MG capsule    Sig: Take 1 capsule (100 mg total) by mouth 2 (two) times daily.    Dispense:  10 capsule    Refill:  0    Orders Placed This Encounter  Procedures  . Urine culture  . POCT urinalysis dipstick

## 2015-12-09 NOTE — Assessment & Plan Note (Signed)
Will wean patient down by 0.25 mg each week to off; Rx printed and given to patient; advised never mix with alcohol, pain pills, sleeping pills

## 2015-12-09 NOTE — Assessment & Plan Note (Signed)
Cranberry as preventive measure; start macrobid today; culture pending; void after intimacy; will check culture results and then prescribe prophylactic antibiotic to take after intimacy; if recurring even then, will refer to urologist

## 2015-12-09 NOTE — Assessment & Plan Note (Signed)
Continue venlafaxine (cautioned to NEVER stop this abruptly) and wellbutrin; will wean her off of clonazepam by 0.25 mg each week; encouraged her to start counseling; list of counselors in the area given to patient

## 2015-12-13 ENCOUNTER — Other Ambulatory Visit: Payer: Self-pay

## 2015-12-13 ENCOUNTER — Other Ambulatory Visit: Payer: Self-pay | Admitting: Family Medicine

## 2015-12-13 LAB — URINE CULTURE

## 2015-12-13 MED ORDER — DOXYCYCLINE HYCLATE 100 MG PO TABS: 100.0000 mg | ORAL_TABLET | Freq: Two times a day (BID) | ORAL | Status: DC

## 2015-12-13 MED ORDER — DOXYCYCLINE HYCLATE 100 MG PO TABS: 100.0000 mg | ORAL_TABLET | Freq: Two times a day (BID) | ORAL | Status: AC

## 2015-12-28 ENCOUNTER — Telehealth: Payer: Self-pay

## 2015-12-28 NOTE — Telephone Encounter (Signed)
Patient was told by you she needed to take a antibiotic every time she had intercourse with husband?  Please send in rx

## 2016-01-02 MED ORDER — NITROFURANTOIN MACROCRYSTAL 50 MG PO CAPS: ORAL_CAPSULE | ORAL | Status: DC

## 2016-01-02 NOTE — Telephone Encounter (Signed)
Rx sent 

## 2016-01-27 ENCOUNTER — Ambulatory Visit (INDEPENDENT_AMBULATORY_CARE_PROVIDER_SITE_OTHER): Payer: BLUE CROSS/BLUE SHIELD | Admitting: Family Medicine

## 2016-01-27 ENCOUNTER — Telehealth: Payer: Self-pay | Admitting: Family Medicine

## 2016-01-27 ENCOUNTER — Telehealth: Payer: Self-pay | Admitting: *Deleted

## 2016-01-27 ENCOUNTER — Encounter: Payer: Self-pay | Admitting: Family Medicine

## 2016-01-27 VITALS — BP 104/68 | HR 107 | Temp 98.3°F | Resp 18 | Ht 63.0 in | Wt 161.4 lb

## 2016-01-27 DIAGNOSIS — L259 Unspecified contact dermatitis, unspecified cause: Secondary | ICD-10-CM | POA: Diagnosis not present

## 2016-01-27 DIAGNOSIS — R8290 Unspecified abnormal findings in urine: Secondary | ICD-10-CM | POA: Diagnosis not present

## 2016-01-27 DIAGNOSIS — T783XXA Angioneurotic edema, initial encounter: Secondary | ICD-10-CM | POA: Diagnosis not present

## 2016-01-27 DIAGNOSIS — Z23 Encounter for immunization: Secondary | ICD-10-CM

## 2016-01-27 MED ORDER — CEPHALEXIN 500 MG PO CAPS: 500.0000 mg | ORAL_CAPSULE | Freq: Four times a day (QID) | ORAL | Status: DC

## 2016-01-27 MED ORDER — RANITIDINE HCL 150 MG PO TABS: 150.0000 mg | ORAL_TABLET | Freq: Two times a day (BID) | ORAL | Status: DC

## 2016-01-27 MED ORDER — LORATADINE 10 MG PO TABS: 10.0000 mg | ORAL_TABLET | Freq: Two times a day (BID) | ORAL | Status: DC

## 2016-01-27 MED ORDER — PREDNISONE 10 MG (48) PO TBPK: ORAL_TABLET | Freq: Every day | ORAL | Status: DC

## 2016-01-27 NOTE — Telephone Encounter (Signed)
PT SAID THAT YOU TOLD HER TO CALL AND LET YOU KNOW ABOUT THE MEDICATION YOU GAVE HER FOR HER FACE. AND SAID THAT YOU TOLD HER TO LET YOU KNOW IF IT WAS WORKING AND IT IS NOT WORKING. SAID THAT YOU WERE TO CALL HER ZYRTEC BUT YOU CALLED IN ZANTAC. HER RX NEEDS TO BE CALLED INTO PHARM [facility] [address].

## 2016-01-27 NOTE — Addendum Note (Signed)
Addended by: Alba CorySOWLES, Odetta Forness F on: 01/27/2016 09:54 AM   Modules accepted: Orders

## 2016-01-27 NOTE — Telephone Encounter (Signed)
Patient notified

## 2016-01-27 NOTE — Telephone Encounter (Signed)
Opened in error

## 2016-01-27 NOTE — Telephone Encounter (Signed)
I sent Ranitidine for itching and also Loratadine She can get rx of Keflex ( antibiotics) at pharmacy if no improvement by the morning or if it gets worse

## 2016-01-27 NOTE — Progress Notes (Addendum)
Name: Krystal Crane   MRN: [id number]    DOB: [birth year]   Date:01/27/2016       Progress Note  Subjective  Chief Complaint  Chief Complaint  Patient presents with  . Cellulitis    patient's husband noticed on Wednesday afternoon that the top of her right cheek was red and it has progressed over time. she stated that it was only swollen at first and now it is very sore, hot and itchy. patient stated that last night she noticed some bumps down the right side of her face. she also stated that her eyes has been watering.  . Immunizations    tdap    HPI  Facial rash: she worked in her yard pulling weed Wednesday  Night and woke up Thursday am with right side of face edema, redness and right eye was shut from puffy eyelid. She states she has also noticed pruritus, and mild tenderness around right eye. This morning it bumps has spread to both sides of neck and also some on left side of cheek. She denies fever, no change in appetite, feels a little tired. No nausea or vomiting. Previous history of rhus dermatitis.   Urine Odor: treated in [place] for UTI and culture was positive still having symptoms, no dysuria or frequency we will recheck urine    Patient Active Problem List   Diagnosis Date Noted  . Recurrent urinary tract infection 12/09/2015  . 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 Benzodiazepine dependence, continuous (HCC) 05/10/2015  . Major depressive disorder, recurrent episode, in partial remission with anxious distress (HCC) 04/20/2015  . Cholelithiasis without obstruction 04/20/2015  . HLD (hyperlipidemia) 04/20/2015  . 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 Cerebral vascular accident (HCC) 04/20/2015  . Headache, tension-type 04/20/2015  . Basilar artery stenosis 04/20/2015  . Nodule of finger of left hand 04/20/2015  . Calculus of gallbladder 01/25/2014  . 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 Cephalalgia 04/21/2012  . Diffuse cerebrovascular  disease 02/28/2011    Past Surgical History  Procedure Laterality Date  . Cesarean section      3  . Brain surgery      bypass surgery    Family History  Problem Relation Age of Onset  . Hypertension Mother   . Stroke Mother   . Hyperlipidemia Father   . Hypertension Father     Social History   Social History  . Marital Status: Married    Spouse Name: N/A  . Number of Children: N/A  . Years of Education: N/A   Occupational History  . Not on file.   Social History Main Topics  . Smoking status: Never Smoker   . Smokeless tobacco: Not on file  . Alcohol Use: 0.0 oz/week    0 Standard drinks or equivalent per week     Comment: social  . Drug Use: No  . Sexual Activity:    Partners: Male   Other Topics Concern  . Not on file   Social History Narrative     Current outpatient prescriptions:  .  aspirin EC 325 MG tablet, Take 325 mg by mouth., Disp: , Rfl:  .  buPROPion (WELLBUTRIN XL) 300 MG 24 hr tablet, TAKE 1 TABLET DAILY, Disp: 90 tablet, Rfl: 1 .  chlorzoxazone (PARAFON) 500 MG tablet, Take 1 tablet by mouth 4 (four) times daily as needed., Disp: , Rfl:  .  metoCLOPramide (REGLAN) 10 MG tablet, Take 10 mg by mouth 4 (four) times daily., Disp: ,  Rfl:  .  pregabalin (LYRICA) 50 MG capsule, Take 50 mg by mouth 3 (three) times daily. Work up to 6 tabs a day, Disp: , Rfl:  .  QUDEXY XR 200 MG CS24, Take 1 capsule by mouth at bedtime., Disp: , Rfl: 3 .  venlafaxine XR (EFFEXOR-XR) 75 MG 24 hr capsule, TAKE 3 CAPSULES BY MOUTH DAILY WITH BREAKFAST, Disp: 270 capsule, Rfl: 1 .  loratadine (CLARITIN) 10 MG tablet, Take 1 tablet (10 mg total) by mouth 2 (two) times daily., Disp: 30 tablet, Rfl: 0 .  predniSONE (STERAPRED UNI-PAK 48 TAB) 10 MG (48) TBPK tablet, Take by mouth daily. Take as directed, Disp: 48 tablet, Rfl: 0 .  ranitidine (ZANTAC) 150 MG tablet, Take 1 tablet (150 mg total) by mouth 2 (two) times daily., Disp: 30 tablet, Rfl: 0  Allergies  Allergen  Reactions  . Levetiracetam Hives  . Ondansetron Nausea And Vomiting     ROS  Ten systems reviewed and is negative except as mentioned in HPI   Objective  Filed Vitals:   01/27/16 0843  BP: 104/68  Pulse: 107  Temp: 98.3 F (36.8 C)  TempSrc: Oral  Resp: 18  Height: 5\' 3"  (1.6 m)  Weight: 161 lb 6.4 oz (73.211 kg)  SpO2: 97%    Body mass index is 28.6 kg/(m^2).  Physical Exam  Constitutional: Patient appears well-developed and well-nourished. No distress.  HEENT: head atraumatic, normocephalic, pupils equal and reactive to light,  neck supple, throat within normal limits Cardiovascular: Normal rate, regular rhythm and normal heart sounds.  No murmur heard. No BLE edema. Pulmonary/Chest: Effort normal and breath sounds normal. No respiratory distress. Abdominal: Soft.  There is no tenderness. Psychiatric: Patient has a normal mood and affect. behavior is normal. Judgment and thought content normal. Rash: right side of face is puffy with erythema and also increase in warmth, not very tender to touch, no induration, she also has erythematous papules on right side of neck and also a few on left side of neck and left cheek. Conjunctiva is normal, but eye is itchy and she keeps wiping with a wet tissue paper  Recent Results (from the past 2160 hour(s))  Urine culture     Status: Abnormal   Collection Time: 12/09/15 12:00 AM  Result Value Ref Range   Urine Culture, Routine Final report (A)    Urine Culture result 1 Klebsiella pneumoniae (A)     Comment: Greater than 100,000 colony forming units per mL   ANTIMICROBIAL SUSCEPTIBILITY Comment     Comment:       ** S = Susceptible; I = Intermediate; R = Resistant **                    P = Positive; N = Negative             MICS are expressed in micrograms per mL    Antibiotic                 RSLT#1    RSLT#2    RSLT#3    RSLT#4 Amoxicillin/Clavulanic Acid    S Ampicillin                     R Cefepime                        S Ceftriaxone  S Cefuroxime                     S Cephalothin                    S Ciprofloxacin                  S Ertapenem                      S Gentamicin                     S Imipenem                       S Levofloxacin                   S Nitrofurantoin                 I Piperacillin                   R Tetracycline                   S Tobramycin                     S Trimethoprim/Sulfa             S   POCT urinalysis dipstick     Status: Abnormal   Collection Time: 12/09/15  9:26 AM  Result Value Ref Range   Color, UA drk yellow    Clarity, UA clear    Glucose, UA neg    Bilirubin, UA neg    Ketones, UA neg    Spec Grav, UA 1.020    Blood, UA neg    pH, UA 6.0    Protein, UA neg    Urobilinogen, UA 0.2    Nitrite, UA positive    Leukocytes, UA large (3+) (A) Negative      PHQ2/9: Depression screen [facility] 2/9 01/27/2016 12/09/2015 07/18/2015 04/20/2015  Decreased Interest 0 0 0 0  Down, Depressed, Hopeless 0 0 2 1  PHQ - 2 Score 0 0 2 1  Altered sleeping - - 3 -  Tired, decreased energy - - 3 -  Change in appetite - - 1 -  Feeling bad or failure about yourself  - - 1 -  Trouble concentrating - - 2 -  Moving slowly or fidgety/restless - - 1 -  Suicidal thoughts - - 0 -  PHQ-9 Score - - 13 -     Fall Risk: Fall Risk  01/27/2016 12/09/2015 07/18/2015 04/20/2015  Falls in the past year? No No No No     Functional Status Survey: Is the patient deaf or have difficulty hearing?: No Does the patient have difficulty seeing, even when wearing glasses/contacts?: Yes Does the patient have difficulty concentrating, remembering, or making decisions?: No Does the patient have difficulty walking or climbing stairs?: No Does the patient have difficulty dressing or bathing?: No Does the patient have difficulty doing errands alone such as visiting a doctor's office or shopping?: No   Assessment & Plan  1. Contact dermatitis  Advised patient to take 2  doses and call me back if improvement of symptoms. It does not look like cellulitis at this time, but if no improvement I will call in antibiotics - loratadine (CLARITIN) 10 MG tablet; Take 1 tablet (10 mg  total) by mouth 2 (two) times daily.  Dispense: 30 tablet; Refill: 0 - ranitidine (ZANTAC) 150 MG tablet; Take 1 tablet (150 mg total) by mouth 2 (two) times daily.  Dispense: 30 tablet; Refill: 0 - predniSONE (STERAPRED UNI-PAK 48 TAB) 10 MG (48) TBPK tablet; Take by mouth daily. Take as directed  Dispense: 48 tablet; Refill: 0  2. Allergic angioedema, initial encounter  - loratadine (CLARITIN) 10 MG tablet; Take 1 tablet (10 mg total) by mouth 2 (two) times daily.  Dispense: 30 tablet; Refill: 0 - ranitidine (ZANTAC) 150 MG tablet; Take 1 tablet (150 mg total) by mouth 2 (two) times daily.  Dispense: 30 tablet; Refill: 0 - predniSONE (STERAPRED UNI-PAK 48 TAB) 10 MG (48) TBPK tablet; Take by mouth daily. Take as directed  Dispense: 48 tablet; Refill: 0  3. Abnormal urine odor  - Urine culture   4. Need for Tdap vaccination  - Tdap vaccine greater than or equal to 7yo IM

## 2016-01-30 ENCOUNTER — Other Ambulatory Visit: Payer: Self-pay | Admitting: Family Medicine

## 2016-01-30 LAB — URINE CULTURE

## 2016-01-30 MED ORDER — CIPROFLOXACIN HCL 250 MG PO TABS: 250.0000 mg | ORAL_TABLET | Freq: Two times a day (BID) | ORAL | Status: DC

## 2016-02-02 ENCOUNTER — Other Ambulatory Visit: Payer: Self-pay

## 2016-02-02 MED ORDER — VENLAFAXINE HCL ER 75 MG PO CP24: 225.0000 mg | ORAL_CAPSULE | Freq: Every day | ORAL | Status: DC

## 2016-02-02 MED ORDER — BUPROPION HCL ER (XL) 300 MG PO TB24: 300.0000 mg | ORAL_TABLET | Freq: Every day | ORAL | Status: DC

## 2016-04-20 LAB — HM PAP SMEAR: HM Pap smear: NEGATIVE

## 2016-05-01 ENCOUNTER — Other Ambulatory Visit: Payer: Self-pay | Admitting: Family Medicine

## 2016-05-01 NOTE — Telephone Encounter (Signed)
Last note reviewed; due for appt late October

## 2016-05-10 ENCOUNTER — Encounter: Payer: Self-pay | Admitting: Family Medicine

## 2016-05-10 ENCOUNTER — Ambulatory Visit (INDEPENDENT_AMBULATORY_CARE_PROVIDER_SITE_OTHER): Payer: BLUE CROSS/BLUE SHIELD | Admitting: Family Medicine

## 2016-05-10 VITALS — BP 124/82 | HR 101 | Temp 98.7°F | Resp 16 | Ht 63.0 in | Wt 165.7 lb

## 2016-05-10 DIAGNOSIS — E663 Overweight: Secondary | ICD-10-CM

## 2016-05-10 DIAGNOSIS — F418 Other specified anxiety disorders: Principal | ICD-10-CM

## 2016-05-10 DIAGNOSIS — E559 Vitamin D deficiency, unspecified: Secondary | ICD-10-CM

## 2016-05-10 DIAGNOSIS — F3341 Major depressive disorder, recurrent, in partial remission: Secondary | ICD-10-CM | POA: Diagnosis not present

## 2016-05-10 DIAGNOSIS — G44229 Chronic tension-type headache, not intractable: Secondary | ICD-10-CM

## 2016-05-10 DIAGNOSIS — Z23 Encounter for immunization: Secondary | ICD-10-CM | POA: Diagnosis not present

## 2016-05-10 NOTE — Assessment & Plan Note (Addendum)
Continue the venlafaxine and bupropion, and refer to psychiatrist

## 2016-05-10 NOTE — Progress Notes (Signed)
BP 124/82 (BP Location: Left Arm, Patient Position: Sitting, Cuff Size: Normal)   Pulse (!) 101   Temp 98.7 F (37.1 C) (Oral)   Resp 16   Ht 5\' 3"  (1.6 m)   Wt 165 lb 11.2 oz (75.2 kg)   SpO2 96%   BMI 29.35 kg/m    Subjective:    Patient ID: Krystal Crane, female    DOB: Apr 22, 1976, 40 y.o.   MRN: [id number]  HPI: Krystal Crane is a 40 y.o. female  Chief Complaint  Patient presents with  . Headache    discuss change of daily meds   Patient is here to discuss changing some medicines The neurologist actually suggested starting an SNRI but she is already on 225 mg of venlafaxine She does not see a psychiatrist now; saw one years ago; husband says she is depressed; she is really sad and sits around all the time; she's not her normal self; not as energetic and her usual happy self; not the person he married Still on same dose of Qudexy, not going up on dose b/c of memory He (neurologist) is going to get a sleep study Reviewed neurology note brought by patient OB just checked all of her blood work two weeks ago and everything was fine, patient says; her mother has stage IV liver disease; pt says her thyroid was fine, vitamin D was a little low; not low enough for RX Obesity; not eating fast foods; 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, 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; drinking plenty of water; she does skip meals  [facility] 01/27/2016 12/09/2015 07/18/2015 04/20/2015  Decreased Interest 0 0 0 0  Down, Depressed, Hopeless 0 0 2 1  PHQ - 2 Score 0 0 2 1  Altered sleeping - - 3 -  Tired, decreased energy - - 3 -  Change in appetite - - 1 -  Feeling bad or failure about yourself  - - 1 -  Trouble concentrating - - 2 -  Moving slowly or fidgety/restless - - 1 -  Suicidal thoughts - - 0 -  PHQ-9 Score - - 13 -  MD note: NO SI/HI today  Relevant past medical, surgical, family and social history reviewed Past Medical History:  Diagnosis Date  . Anxiety   . Depression    . Hyperlipidemia   . Recurrent urinary tract infection 12/09/2015  MD note: chronic headaches, sees neurologist  Past Surgical History:  Procedure Laterality Date  . BRAIN SURGERY     bypass surgery  . CESAREAN SECTION     3   Family History  Problem Relation Age of Onset  . Hypertension Mother   . Stroke Mother   . Hyperlipidemia Father   . Hypertension Father    Social History  Substance Use Topics  . Smoking status: Never Smoker  . Smokeless tobacco: Not on file  . Alcohol use 0.0 oz/week     Comment: social   Interim medical history since last visit reviewed. Allergies and medications reviewed  Review of Systems Per HPI unless specifically indicated above     Objective:    BP 124/82 (BP Location: Left Arm, Patient Position: Sitting, Cuff Size: Normal)   Pulse (!) 101   Temp 98.7 F (37.1 C) (Oral)   Resp 16   Ht 5\' 3"  (1.6 m)   Wt 165 lb 11.2 oz (75.2 kg)   SpO2 96%   BMI 29.35 kg/m   Wt Readings from Last 3  Encounters:  05/10/16 165 lb 11.2 oz (75.2 kg)  01/27/16 161 lb 6.4 oz (73.2 kg)  12/09/15 163 lb (73.9 kg)    Physical Exam  Constitutional: She appears well-developed and well-nourished. No distress.  Eyes: EOM are normal. No scleral icterus.  Neck: No thyromegaly present.  Cardiovascular: Normal rate and regular rhythm.   Rate under 100 during auscultation  Pulmonary/Chest: Effort normal.  Abdominal: She exhibits no distension.  Skin: No pallor.  Psychiatric: She has a normal mood and affect. Her behavior is normal. Judgment and thought content normal.      Assessment & Plan:   Problem List Items Addressed This Visit      Other   Vitamin D deficiency    Recommended vitamin D supplementation, but not to excess, as it's fat soluble and too much can be harmful; I do not have gyn results for review      Overweight (BMI 25.0-29.9)    See after visit summary; encouraged modest weight loss      Major depressive disorder, recurrent  episode, in partial remission with anxious distress (HCC) - Primary    Continue the venlafaxine and bupropion, and refer to psychiatrist      Relevant Orders   Ambulatory referral to Psychiatry   Headache, tension-type    Recurrent hedaches, managed by neurologist; appreciate note brought by patient; reviewed       Other Visit Diagnoses    Needs flu shot       Relevant Orders   Flu Vaccine QUAD 36+ mos PF IM (Fluarix & Fluzone Quad PF) (Completed)       Follow up plan: No Follow-up on file.  An after-visit summary was printed and given to the patient at check-out.  Please see the patient instructions which may contain other information and recommendations beyond what is mentioned above in the assessment and plan.  No orders of the defined types were placed in this encounter.   Orders Placed This Encounter  Procedures  . Flu Vaccine QUAD 36+ mos PF IM (Fluarix & Fluzone Quad PF)  . Ambulatory referral to Psychiatry

## 2016-05-10 NOTE — Patient Instructions (Addendum)
Please do call your neurologist about the sleep study We'll have you see the psychiatrist Pick up 1000 iu of vitamin D3 and take once a day, but not more unless directed by a doctor Contact a counselor Check out the information at familydoctor.org entitled "Nutrition for Weight Loss: What You Need to Know about Fad Diets" Try to lose between 1-2 pounds per week by taking in fewer calories and burning off more calories You can succeed by limiting portions, limiting foods dense in calories and fat, becoming more active, and drinking 8 glasses of water a day (64 ounces) Don't skip meals, especially breakfast, as skipping meals may alter your metabolism Do not use over-the-counter weight loss pills or gimmicks that claim rapid weight loss A healthy BMI (or body mass index) is between 18.5 and 24.9 You can calculate your ideal BMI at the NIH website [employer]huhttp://www.nhlbi.nih.gov/health/educational/lose_wt/BMI/bmicalc.htm

## 2016-05-11 ENCOUNTER — Encounter: Payer: Self-pay | Admitting: Family Medicine

## 2016-05-13 DIAGNOSIS — E663 Overweight: Secondary | ICD-10-CM | POA: Insufficient documentation

## 2016-05-13 DIAGNOSIS — E559 Vitamin D deficiency, unspecified: Secondary | ICD-10-CM | POA: Insufficient documentation

## 2016-05-13 NOTE — Assessment & Plan Note (Signed)
See after visit summary; encouraged modest weight loss

## 2016-05-13 NOTE — Assessment & Plan Note (Signed)
Recommended vitamin D supplementation, but not to excess, as it's fat soluble and too much can be harmful; I do not have gyn results for review

## 2016-05-13 NOTE — Assessment & Plan Note (Signed)
Recurrent hedaches, managed by neurologist; appreciate note brought by patient; reviewed

## 2016-05-18 ENCOUNTER — Other Ambulatory Visit: Payer: Self-pay

## 2016-05-18 NOTE — Telephone Encounter (Signed)
Needs 90 day to mail order

## 2016-05-19 MED ORDER — BUPROPION HCL ER (XL) 300 MG PO TB24: 300.0000 mg | ORAL_TABLET | Freq: Every day | ORAL | 1 refills | Status: DC

## 2016-05-19 NOTE — Telephone Encounter (Signed)
rx approved

## 2016-05-24 ENCOUNTER — Other Ambulatory Visit: Payer: Self-pay | Admitting: Family Medicine

## 2016-05-24 NOTE — Telephone Encounter (Signed)
Pt has been referred to psychiatrist; I'll approved 30 days instead of 90 days in case psych changes up her medicine; Rx sent

## 2016-06-12 ENCOUNTER — Encounter: Payer: Self-pay | Admitting: Psychiatry

## 2016-06-12 ENCOUNTER — Ambulatory Visit (INDEPENDENT_AMBULATORY_CARE_PROVIDER_SITE_OTHER): Payer: BLUE CROSS/BLUE SHIELD | Admitting: Psychiatry

## 2016-06-12 VITALS — BP 106/75 | HR 80 | Temp 97.4°F | Wt 161.4 lb

## 2016-06-12 DIAGNOSIS — F331 Major depressive disorder, recurrent, moderate: Secondary | ICD-10-CM | POA: Diagnosis not present

## 2016-06-12 DIAGNOSIS — F411 Generalized anxiety disorder: Secondary | ICD-10-CM

## 2016-06-12 NOTE — Progress Notes (Signed)
Psychiatric Initial Adult Assessment   Patient Identification: Krystal Crane MRN:  [id number] Date of Evaluation:  06/12/2016 Referral Source: Dr.Lada  Chief Complaint:   stress and depression and anxiety. Visit Diagnosis:    ICD-9-CM ICD-10-CM   1. MDD (major depressive disorder), recurrent episode, moderate (HCC) 296.32 F33.1   2. GAD (generalized anxiety disorder) 300.02 F41.1     History of Present Illness:  Patient is a 40 year old Caucasian woman who was referred by her primary care physician for evaluation and medication management for her depression and anxiety. Patient reports that she is been having depression and anxiety for several years and it has gotten worse recently in the past month or so. States that she takes Effexor on 75 mg tablets 4 daily and the also Wellbutrin 300 mg daily she reports that she's been taking this combination for several years and most recently her on Effexor was increased from 3 tablets to 4 tablets. She reports that she is also under a lot of stress in her marriage and that's one of the main reasons for her to feel so bad. States she is sleeping very poorly about 3 hours per night. States that in the past she was taking Klonopin and her primary care physician Dr. Lazarus SalinesLott has tapered her off the Klonopin. Currently patient reports depressed mood, anxiety and frequent headaches due to her migraine. She reports marital stress and thinks not being well with her husband and herself. She reports increased anxiety over the past month though she is unable to say if the increase in Effexor caused it. She denies any suicidal thoughts. She denies abuse of alcohol or other substances. She denies psychotic symptoms.   Associated Signs/Symptoms: Depression Symptoms:  depressed mood, anhedonia, insomnia, psychomotor agitation, feelings of worthlessness/guilt, anxiety, panic attacks, disturbed sleep, (Hypo) Manic Symptoms:  Distractibility, Irritable Mood, Anxiety  Symptoms:  Excessive Worry, Psychotic Symptoms:  denies PTSD Symptoms: denies  Past Psychiatric History:   Previous Psychotropic Medications: Yes   Substance Abuse History in the last 12 months:  No.  Consequences of Substance Abuse: Negative  Past Medical History:  Past Medical History:  Diagnosis Date  . Anxiety   . Depression   . Hyperlipidemia   . Recurrent urinary tract infection 12/09/2015    Past Surgical History:  Procedure Laterality Date  . BRAIN SURGERY     bypass surgery  . CESAREAN SECTION     3    Family Psychiatric History:   Family History:  Family History  Problem Relation Age of Onset  . Hypertension Mother   . Stroke Mother   . Hyperlipidemia Father   . Hypertension Father     Social History:   Social History   Social History  . Marital status: Married    Spouse name: N/A  . Number of children: N/A  . Years of education: N/A   Social History Main Topics  . Smoking status: Never Smoker  . Smokeless tobacco: Never Used  . Alcohol use No     Comment: social  . Drug use: No  . Sexual activity: Yes    Partners: Male    Birth control/ protection: IUD   Other Topics Concern  . None   Social History Narrative  . None    Additional Social History:   Allergies:   Allergies  Allergen Reactions  . Levetiracetam Hives  . Ondansetron Nausea And Vomiting    Metabolic Disorder Labs: Lab Results  Component Value Date   HGBA1C 5.3 09/05/2012  No results found for: PROLACTIN No results found for: CHOL, TRIG, HDL, CHOLHDL, VLDL, LDLCALC   Current Medications: Current Outpatient Prescriptions  Medication Sig Dispense Refill  . aspirin EC 325 MG tablet Take 325 mg by mouth.    Marland Kitchen. buPROPion (WELLBUTRIN XL) 300 MG 24 hr tablet Take 1 tablet (300 mg total) by mouth daily. 90 tablet 1  . chlorzoxazone (PARAFON) 500 MG tablet Take 1 tablet by mouth 4 (four) times daily as needed.    . pregabalin (LYRICA) 50 MG capsule Take 50 mg by  mouth 3 (three) times daily. Work up to 6 tabs a day    . QUDEXY XR 200 MG CS24 Take 1 capsule by mouth at bedtime.  3  . venlafaxine XR (EFFEXOR-XR) 75 MG 24 hr capsule TAKE 3 CAPSULES (225 MG TOTAL) BY MOUTH DAILY WITH BREAKFAST. 90 capsule 0   No current facility-administered medications for this visit.     Neurologic: Headache: No Seizure: No Paresthesias:No  Musculoskeletal: Strength & Muscle Tone: within normal limits Gait & Station: normal Patient leans: N/A  Psychiatric Specialty Exam: ROS  Blood pressure 106/75, pulse 80, temperature 97.4 F (36.3 C), temperature source Oral, weight 161 lb 6.4 oz (73.2 kg), last menstrual period 05/18/2016.Body mass index is 28.59 kg/m.  General Appearance: Casual  Eye Contact:  Fair  Speech:  Clear and Coherent  Volume:  Normal  Mood:  Anxious, Depressed and Dysphoric  Affect:  Congruent  Thought Process:  Coherent  Orientation:  Full (Time, Place, and Person)  Thought Content:  Logical  Suicidal Thoughts:  No  Homicidal Thoughts:  No  Memory:  Immediate;   Fair Recent;   Fair Remote;   Fair  Judgement:  Fair  Insight:  Fair  Psychomotor Activity:  Normal  Concentration:  Concentration: Fair and Attention Span: Fair  Recall:  [employer]  Fund of Knowledge:Fair  Language: Fair  Akathisia:  No  Handed:  Right  AIMS (if indicated):  na  Assets:  Communication Skills Desire for Improvement Financial Resources/Insurance Resilience Social Support Vocational/Educational  ADL's:  Intact  Cognition: WNL  Sleep:  poor    Treatment Plan Summary:  Major depressive disorder moderate Continue Wellbutrin at 300 mg daily Decrease Effexor 75 mg to 3 tablets daily for 1 week and then decrease to 2 tablets daily thereafter. Start therapy and an appointment will be made for patient to see a therapist at this clinic.  Generalized anxiety disorder Same as above  Insomnia Patient reports that Lyrica helps her  Migraines Patient was  recently prescribed Seroquel 25 mg to take anywhere from 1-4 tablets daily by her neurologist. Patient has not started taking this medication. She was recommended to start taking 1 tablet of 25 mg at bedtime Sectral also help her sleep and reduce some of the anxiety.  Return to clinic in 3 weeks time or call before if needed    Krishna Heuer, Georges MouseHIMABINDU, MD 10/31/20179:31 AM

## 2016-06-19 ENCOUNTER — Other Ambulatory Visit: Payer: Self-pay | Admitting: Family Medicine

## 2016-06-21 ENCOUNTER — Ambulatory Visit (INDEPENDENT_AMBULATORY_CARE_PROVIDER_SITE_OTHER): Payer: BLUE CROSS/BLUE SHIELD | Admitting: Licensed Clinical Social Worker

## 2016-06-21 ENCOUNTER — Ambulatory Visit: Payer: BLUE CROSS/BLUE SHIELD | Admitting: Licensed Clinical Social Worker

## 2016-06-21 DIAGNOSIS — F331 Major depressive disorder, recurrent, moderate: Secondary | ICD-10-CM | POA: Diagnosis not present

## 2016-06-21 DIAGNOSIS — F411 Generalized anxiety disorder: Secondary | ICD-10-CM | POA: Diagnosis not present
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
## 2016-07-03 ENCOUNTER — Encounter: Payer: Self-pay | Admitting: Psychiatry

## 2016-07-03 ENCOUNTER — Ambulatory Visit (INDEPENDENT_AMBULATORY_CARE_PROVIDER_SITE_OTHER): Payer: BLUE CROSS/BLUE SHIELD | Admitting: Psychiatry

## 2016-07-03 VITALS — BP 98/64 | HR 96 | Ht 63.0 in | Wt 158.0 lb

## 2016-07-03 DIAGNOSIS — F411 Generalized anxiety disorder: Secondary | ICD-10-CM | POA: Diagnosis not present

## 2016-07-03 DIAGNOSIS — F331 Major depressive disorder, recurrent, moderate: Secondary | ICD-10-CM | POA: Diagnosis not present

## 2016-07-03 MED ORDER — VENLAFAXINE HCL ER 75 MG PO CP24: 150.0000 mg | ORAL_CAPSULE | Freq: Every day | ORAL | 1 refills | Status: DC

## 2016-07-03 MED ORDER — TRAZODONE HCL 50 MG PO TABS: 50.0000 mg | ORAL_TABLET | Freq: Every day | ORAL | 1 refills | Status: DC
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
## 2016-07-09 ENCOUNTER — Ambulatory Visit: Payer: BLUE CROSS/BLUE SHIELD | Admitting: Licensed Clinical Social Worker

## 2016-07-18 ENCOUNTER — Ambulatory Visit (INDEPENDENT_AMBULATORY_CARE_PROVIDER_SITE_OTHER): Payer: BLUE CROSS/BLUE SHIELD | Admitting: Psychiatry

## 2016-07-18 ENCOUNTER — Telehealth: Payer: Self-pay

## 2016-07-18 ENCOUNTER — Encounter: Payer: Self-pay | Admitting: Psychiatry

## 2016-07-18 ENCOUNTER — Ambulatory Visit (INDEPENDENT_AMBULATORY_CARE_PROVIDER_SITE_OTHER): Payer: BLUE CROSS/BLUE SHIELD | Admitting: Licensed Clinical Social Worker

## 2016-07-18 VITALS — BP 105/74 | HR 94 | Temp 99.6°F | Wt 158.8 lb

## 2016-07-18 DIAGNOSIS — F411 Generalized anxiety disorder: Secondary | ICD-10-CM

## 2016-07-18 DIAGNOSIS — F331 Major depressive disorder, recurrent, moderate: Secondary | ICD-10-CM | POA: Diagnosis not present

## 2016-07-18 DIAGNOSIS — F3341 Major depressive disorder, recurrent, in partial remission: Secondary | ICD-10-CM

## 2016-07-18 DIAGNOSIS — F418 Other specified anxiety disorders: Principal | ICD-10-CM

## 2016-07-18 MED ORDER — BUPROPION HCL ER (XL) 150 MG PO TB24: 150.0000 mg | ORAL_TABLET | Freq: Every day | ORAL | 0 refills | Status: DC

## 2016-07-18 NOTE — Telephone Encounter (Signed)
Patient called states you referred her to psychiatrist [facility] and she is unhappy with their services can you please refer her some where else?

## 2016-07-18 NOTE — Assessment & Plan Note (Signed)
Refer to another psychiatrist per patient request

## 2016-07-18 NOTE — Telephone Encounter (Signed)
New referral entered 

## 2016-07-18 NOTE — Progress Notes (Signed)
Psychiatric Progress note  Patient Identification: Krystal Crane MRN:  [id number] Date of Evaluation:  07/18/2016 Referral Source: Dr.Lada  Chief Complaint:   Chief Complaint    Follow-up; Medication Refill     stress and depression and anxiety. Visit Diagnosis:    ICD-9-CM ICD-10-CM   1. MDD (major depressive disorder), recurrent episode, moderate (HCC) 296.32 F33.1   2. GAD (generalized anxiety disorder) 300.02 F41.1     History of Present Illness:  Patient is a 40 year old Caucasian woman who Is seen for follow-up of her anxiety and depression today. Patient reports today that she is been doing okay and she and her husband are working on their issues at home. States that she continues to have trouble sleeping and has not started the trazodone. States that she is afraid to take any medication that may interfere with her blood pressure. Her neurologist has increased her migraine medication to help with her sleep. She is interested in trying that that first. She also reports that the Wellbutrin was started for sexual side effects and she is not sure if it has helped with her depression or anxiety. She denies any suicidal thoughts.  Past Psychiatric History: See initial history of present illness  Previous Psychotropic Medications: Yes   Substance Abuse History in the last 12 months:  No.  Consequences of Substance Abuse: Negative  Past Medical History:  Past Medical History:  Diagnosis Date  . Anxiety   . Depression   . Hyperlipidemia   . Recurrent urinary tract infection 12/09/2015    Past Surgical History:  Procedure Laterality Date  . BRAIN SURGERY     bypass surgery  . CESAREAN SECTION     3    Family Psychiatric History:   Family History:  Family History  Problem Relation Age of Onset  . Hypertension Mother   . Stroke Mother   . Hyperlipidemia Father   . Hypertension Father     Social History:   Social History   Social History  . Marital status: Married     Spouse name: N/A  . Number of children: N/A  . Years of education: N/A   Social History Main Topics  . Smoking status: Never Smoker  . Smokeless tobacco: Never Used  . Alcohol use No     Comment: social  . Drug use: No  . Sexual activity: Not Currently    Partners: Male    Birth control/ protection: IUD   Other Topics Concern  . None   Social History Narrative  . None    Additional Social History:   Allergies:   Allergies  Allergen Reactions  . Levetiracetam Hives  . Ondansetron Nausea And Vomiting    Metabolic Disorder Labs: Lab Results  Component Value Date   HGBA1C 5.3 09/05/2012   No results found for: PROLACTIN No results found for: CHOL, TRIG, HDL, CHOLHDL, VLDL, LDLCALC   Current Medications: Current Outpatient Prescriptions  Medication Sig Dispense Refill  . aspirin EC 325 MG tablet Take 325 mg by mouth.    Marland Kitchen. buPROPion (WELLBUTRIN XL) 300 MG 24 hr tablet Take 1 tablet (300 mg total) by mouth daily. 90 tablet 1  . chlorzoxazone (PARAFON) 500 MG tablet Take 1 tablet by mouth 4 (four) times daily as needed.    . pregabalin (LYRICA) 50 MG capsule Take 50 mg by mouth 3 (three) times daily. Work up to 6 tabs a day    . QUDEXY XR 200 MG CS24 Take 1 capsule by mouth at  bedtime.  3  . traZODone (DESYREL) 50 MG tablet Take 1 tablet (50 mg total) by mouth at bedtime. 30 tablet 1  . venlafaxine XR (EFFEXOR-XR) 75 MG 24 hr capsule Take 2 capsules (150 mg total) by mouth daily with breakfast. 60 capsule 1   No current facility-administered medications for this visit.     Neurologic: Headache: No Seizure: No Paresthesias:No  Musculoskeletal: Strength & Muscle Tone: within normal limits Gait & Station: normal Patient leans: N/A  Psychiatric Specialty Exam: ROS  Blood pressure 105/74, pulse 94, temperature 99.6 F (37.6 C), temperature source Oral, weight 158 lb 12.8 oz (72 kg), last menstrual period 06/21/2016.Body mass index is 28.13 kg/m.  General  Appearance: Casual  Eye Contact:  Fair  Speech:  Clear and Coherent  Volume:  Normal  Mood:  Anxious, Depressed and Dysphoric  Affect:  Better than before   Thought Process:  Coherent  Orientation:  Full (Time, Place, and Person)  Thought Content:  Logical  Suicidal Thoughts:  No  Homicidal Thoughts:  No  Memory:  Immediate;   Fair Recent;   Fair Remote;   Fair  Judgement:  Fair  Insight:  Fair  Psychomotor Activity:  Normal  Concentration:  Concentration: Fair and Attention Span: Fair  Recall:  [employer]Fair  Fund of Knowledge:Fair  Language: Fair  Akathisia:  No  Handed:  Right  AIMS (if indicated):  na  Assets:  Communication Skills Desire for Improvement Financial Resources/Insurance Resilience Social Support Vocational/Educational  ADL's:  Intact  Cognition: WNL  Sleep:  poor    Treatment Plan Summary:  Major depressive disorder Decrease  Wellbutrin to 150 mg daily for 7 days and then stop. Continue effexor at 150mg  po qd. Continue therapy with Nolon RodNicole Peacock  Generalized anxiety disorder Same as above  Insomnia Discontinue trazodone since patient has not tried this and would like to use her migraine medication   Migraines per neurologist  Return to clinic in 2 weeks time or call before if needed    Ebony Rickel, MD 12/6/20179:10 AM

## 2016-07-19 NOTE — Telephone Encounter (Signed)
[facility] [place]. They will review her information and will contact the patient.

## 2016-07-19 NOTE — Progress Notes (Signed)
   THERAPIST PROGRESS NOTE  Session Time: 2645  Participation Level: Active  Behavioral Response: CasualAlertDepressed  Type of Therapy: Individual Therapy  Treatment Goals addressed: Communication: with husband and Coping  Interventions: CBT, Motivational Interviewing, Solution Focused, Strength-based, Supportive, Family Systems and Reframing  Summary: Krystal Crane is a 40 y.o. female who presents with continued symptoms of her diagnosis.  Patient reports her symptoms a 5 on a 1 to 10 scale.  Patient reports that she is unhappy and overwhelmed with her relationship with her husband.  Patient reports having conversations with him about their relationship.  Patient denies any changes in the relationship.  Patient unable to explore why things have not changed.  Patient was tearful throughout the discussion.  Patient reports that she wants to move forward, but unsure how.  Patient did not want to role play communication.   Patient denies sleeping well and reports that she continues to attempt to lose weight due to conversations with her husband about her weight gain.  Patient reports that things have gone downhill since her stroke several years ago.  Explored with patient her support system.  Suicidal/Homicidal: No  Therapist Response: Clinician was open and interactive throughout the session with patient.  In addition assisted patient with exploring new ideas on how to improve relationship.  Discussion of a hobby or leisure activity to avoid idle time.  Supported. Patient while crying and offered suggestions on how to relieve symptoms.  Completed treatment plan  Plan: Return again in 1 weeks.  Diagnosis: Axis I: Depression    Axis II: No diagnosis    Marinda Elkicole M Wretha Laris, LCSW 07/19/2016

## 2016-07-24 ENCOUNTER — Ambulatory Visit: Payer: BLUE CROSS/BLUE SHIELD | Admitting: Licensed Clinical Social Worker

## 2016-07-30 ENCOUNTER — Ambulatory Visit: Payer: BLUE CROSS/BLUE SHIELD | Admitting: Licensed Clinical Social Worker

## 2016-07-31 ENCOUNTER — Other Ambulatory Visit: Payer: Self-pay

## 2016-07-31 MED ORDER — BUPROPION HCL ER (XL) 150 MG PO TB24: 150.0000 mg | ORAL_TABLET | Freq: Every day | ORAL | 0 refills | Status: DC

## 2016-07-31 NOTE — Telephone Encounter (Signed)
Pt has seen psych, but plans to see another soon

## 2016-08-01 ENCOUNTER — Ambulatory Visit: Payer: BLUE CROSS/BLUE SHIELD | Admitting: Psychiatry

## 2016-08-08 ENCOUNTER — Ambulatory Visit: Payer: Self-pay | Admitting: Licensed Clinical Social Worker

## 2016-09-11 ENCOUNTER — Other Ambulatory Visit: Payer: Self-pay | Admitting: Family Medicine

## 2016-09-12 NOTE — Telephone Encounter (Signed)
I don't prescribe her Qudexy; she'll want to get refills of this from the doctor who prescribes it for her; thank you

## 2016-09-13 NOTE — Telephone Encounter (Signed)
Patient stated she does not need it. Pharmacy mistake.

## 2016-10-18 ENCOUNTER — Ambulatory Visit (INDEPENDENT_AMBULATORY_CARE_PROVIDER_SITE_OTHER): Payer: BLUE CROSS/BLUE SHIELD | Admitting: Family Medicine

## 2016-10-18 ENCOUNTER — Encounter: Payer: Self-pay | Admitting: Family Medicine

## 2016-10-18 VITALS — BP 102/60 | HR 93 | Temp 98.2°F | Wt 136.7 lb

## 2016-10-18 DIAGNOSIS — I639 Cerebral infarction, unspecified: Secondary | ICD-10-CM | POA: Diagnosis not present

## 2016-10-18 DIAGNOSIS — Z5181 Encounter for therapeutic drug level monitoring: Secondary | ICD-10-CM

## 2016-10-18 DIAGNOSIS — N760 Acute vaginitis: Secondary | ICD-10-CM | POA: Diagnosis not present

## 2016-10-18 DIAGNOSIS — R634 Abnormal weight loss: Secondary | ICD-10-CM | POA: Diagnosis not present

## 2016-10-18 DIAGNOSIS — R109 Unspecified abdominal pain: Secondary | ICD-10-CM

## 2016-10-18 DIAGNOSIS — N390 Urinary tract infection, site not specified: Secondary | ICD-10-CM

## 2016-10-18 LAB — POCT URINALYSIS DIPSTICK
Bilirubin, UA: NEGATIVE
GLUCOSE UA: NEGATIVE
Ketones, UA: NEGATIVE
NITRITE UA: NEGATIVE
PH UA: 6
Protein, UA: NEGATIVE
RBC UA: NEGATIVE
Spec Grav, UA: <=1.005
UROBILINOGEN UA: 0.2

## 2016-10-18 LAB — TSH: TSH: 0.83 m[IU]/L

## 2016-10-18 LAB — CBC WITH DIFFERENTIAL/PLATELET
BASOS PCT: 0 %
Basophils Absolute: 0 cells/uL (ref 0–200)
EOS PCT: 2 %
Eosinophils Absolute: 138 cells/uL (ref 15–500)
HCT: 40.9 % (ref 35.0–45.0)
HEMOGLOBIN: 13.2 g/dL (ref 11.7–15.5)
LYMPHS ABS: 2139 {cells}/uL (ref 850–3900)
Lymphocytes Relative: 31 %
MCH: 27.4 pg (ref 27.0–33.0)
MCHC: 32.3 g/dL (ref 32.0–36.0)
MCV: 85 fL (ref 80.0–100.0)
MONOS PCT: 7 %
MPV: 10.5 fL (ref 7.5–12.5)
Monocytes Absolute: 483 cells/uL (ref 200–950)
NEUTROS ABS: 4140 {cells}/uL (ref 1500–7800)
Neutrophils Relative %: 60 %
PLATELETS: 297 10*3/uL (ref 140–400)
RBC: 4.81 MIL/uL (ref 3.80–5.10)
RDW: 15 % (ref 11.0–15.0)
WBC: 6.9 10*3/uL (ref 3.8–10.8)

## 2016-10-18 LAB — COMPREHENSIVE METABOLIC PANEL
ALBUMIN: 4.3 g/dL (ref 3.6–5.1)
ALK PHOS: 73 U/L (ref 33–115)
ALT: 10 U/L (ref 6–29)
AST: 14 U/L (ref 10–30)
BILIRUBIN TOTAL: 0.3 mg/dL (ref 0.2–1.2)
BUN: 11 mg/dL (ref 7–25)
CALCIUM: 9.2 mg/dL (ref 8.6–10.2)
CO2: 27 mmol/L (ref 20–31)
Chloride: 107 mmol/L (ref 98–110)
Creat: 0.74 mg/dL (ref 0.50–1.10)
GLUCOSE: 86 mg/dL (ref 65–99)
POTASSIUM: 3.8 mmol/L (ref 3.5–5.3)
Sodium: 144 mmol/L (ref 135–146)
Total Protein: 6.9 g/dL (ref 6.1–8.1)

## 2016-10-18 MED ORDER — FLUCONAZOLE 150 MG PO TABS: 150.0000 mg | ORAL_TABLET | Freq: Once | ORAL | 0 refills | Status: AC

## 2016-10-18 MED ORDER — NITROFURANTOIN MONOHYD MACRO 100 MG PO CAPS: 100.0000 mg | ORAL_CAPSULE | Freq: Two times a day (BID) | ORAL | 0 refills | Status: DC

## 2016-10-18 NOTE — Assessment & Plan Note (Addendum)
I voiced my concern over the weight loss; she truly believes it matches her intake related to her husband leaving her; will check TSH, CBC, CMET; notify me if ongoing or unexplained weight loss

## 2016-10-18 NOTE — Progress Notes (Signed)
BP 102/60   Pulse 93   Temp 98.2 F (36.8 C) (Oral)   Wt 136 lb 11.2 oz (62 kg)   LMP 10/11/2016   SpO2 94%   BMI 24.22 kg/m    Subjective:    Patient ID: Krystal Crane, female    DOB: January 23, 1976, 41 y.o.   MRN: [id number]  HPI: Krystal Crane is a 41 y.o. female  Chief Complaint  Patient presents with  . Flank Pain    lower left side  . Vaginal Discharge    patient states its very light   She started to have left flank pain 1-1/2 weeks ago; has had kidney stones in the past; tried heating pad; using aleve takes 4 every... Would not tell me how much; taking a little more than that Dr. Tresa Garter She says her husband decided to leave two weeks ago; saw psychiatrist yesterday and O2 was only 92% at that office Never had UTIs and vaginitis, until husband had an affair five years ago, then had UTI, then one yeast infection; has had some dischage 2-3 weeks ago; did OTC medicine to see if that helped; seemed to get a little better; OTC yeast medicine generic; thought maybe that was causing the pain in her back She does not think there was any infidelity and has not been sexually active for months; she declined STD testing No fever No visible blood in her urine or stool Last kidney stone was years ago; kidney stones run in the family (father, son)  Depression screen [facility] 2/9 10/18/2016 01/27/2016 12/09/2015 07/18/2015 04/20/2015  Decreased Interest 0 0 0 0 0  Down, Depressed, Hopeless 1 0 0 2 1  PHQ - 2 Score 1 0 0 2 1  Altered sleeping - - - 3 -  Tired, decreased energy - - - 3 -  Change in appetite - - - 1 -  Feeling bad or failure about yourself  - - - 1 -  Trouble concentrating - - - 2 -  Moving slowly or fidgety/restless - - - 1 -  Suicidal thoughts - - - 0 -  PHQ-9 Score - - - 13 -   Relevant past medical, surgical, family and social history reviewed Past Medical History:  Diagnosis Date  . Anxiety   . Depression   . Hyperlipidemia   . Recurrent urinary tract infection  12/09/2015   Past Surgical History:  Procedure Laterality Date  . BRAIN SURGERY     bypass surgery  . CESAREAN SECTION     3   Family History  Problem Relation Age of Onset  . Hypertension Mother   . Stroke Mother   . Liver disease Mother   . Hyperlipidemia Father   . Hypertension Father   . Stroke Maternal Grandmother   . Cancer Paternal Grandfather    Social History  Substance Use Topics  . Smoking status: Never Smoker  . Smokeless tobacco: Never Used  . Alcohol use No     Comment: social   Interim medical history since last visit reviewed. Allergies and medications reviewed  Review of Systems  Constitutional: Positive for unexpected weight change (she has been losing weight; related to husband leaving her she says).   Per HPI unless specifically indicated above     Objective:    BP 102/60   Pulse 93   Temp 98.2 F (36.8 C) (Oral)   Wt 136 lb 11.2 oz (62 kg)   LMP 10/11/2016   SpO2 94%  BMI 24.22 kg/m   Wt Readings from Last 3 Encounters:  10/18/16 136 lb 11.2 oz (62 kg)  05/10/16 165 lb 11.2 oz (75.2 kg)  01/27/16 161 lb 6.4 oz (73.2 kg)    Physical Exam  Constitutional: She appears well-developed and well-nourished. No distress.  Weight loss noted; not cachectic  Cardiovascular: Normal rate and regular rhythm.   Pulmonary/Chest: Effort normal and breath sounds normal.  Abdominal: Soft. Bowel sounds are normal. She exhibits no distension. There is no tenderness. There is no guarding and no CVA tenderness.  Genitourinary: No erythema or bleeding in the vagina. Vaginal discharge found.  Skin: She is not diaphoretic. No pallor.  Psychiatric:  Good eye contact, but rather flat affect      Assessment & Plan:   Problem List Items Addressed This Visit      Cardiovascular and Mediastinum   Cerebral vascular accident (HCC)    I explained to patient that I would have thought that neurology/neurosurgery would not want her taking four aleve at a time;  advised that is too much; explained max dosing; also explained that there is risk associated with NSAIDs and that perhaps they meant for her to use acetaminophen instead of NSAID, given her hx; message left by my CMA at their office; patient was instructed to follow-up and call her specialist(s) to find out what is safe for her to take; always follow package directions        Genitourinary   Recurrent urinary tract infection    Check urine for infection and blood      Relevant Medications   nitrofurantoin, macrocrystal-monohydrate, (MACROBID) 100 MG capsule     Other   Weight loss    I voiced my concern over the weight loss; she truly believes it matches her intake related to her husband leaving her; will check TSH, CBC, CMET; notify me if ongoing or unexplained weight loss      Relevant Orders   TSH (Completed)   Medication monitoring encounter    Dangerous to take more Aleve than recommended; will check kidneys and liver today; also, not sure this isn't a misunderstanding with neurosurgeon or neurologist, because my training would say only tylenol after stroke; will contact them and double-check; in the meantime, OTC only as exactly recommended on the bottle       Other Visit Diagnoses    Flank pain    -  Primary   Relevant Orders   POCT urinalysis dipstick (Completed)   Comprehensive Metabolic Panel (CMET) (Completed)   CBC with Differential/Platelet (Completed)   Acute vaginitis       Relevant Orders   WET PREP BY MOLECULAR PROBE (Completed)      Follow up plan: No Follow-up on file.  An after-visit summary was printed and given to the patient at check-out.  Please see the patient instructions which may contain other information and recommendations beyond what is mentioned above in the assessment and plan.  Meds ordered this encounter  Medications  . TRINTELLIX 5 MG TABS    Sig: Take 1 tablet by mouth daily.    Refill:  0  . nitrofurantoin, macrocrystal-monohydrate,  (MACROBID) 100 MG capsule    Sig: Take 1 capsule (100 mg total) by mouth 2 (two) times daily.    Dispense:  14 capsule    Refill:  0  . fluconazole (DIFLUCAN) 150 MG tablet    Sig: Take 1 tablet (150 mg total) by mouth once.    Dispense:  1 tablet  Refill:  0    Orders Placed This Encounter  Procedures  . WET PREP BY MOLECULAR PROBE  . Comprehensive Metabolic Panel (CMET)  . CBC with Differential/Platelet  . TSH  . POCT urinalysis dipstick

## 2016-10-18 NOTE — Patient Instructions (Signed)
We'll get labs today Start the antibiotic and the medicine for yeast Only use over-the-counter medicines exactly as recommended on the bottle/package; too much can cause damage to the liver or kidneys and can be very dangerous Stay well-hydrated If your symptoms worsen, please let us know

## 2016-10-18 NOTE — Assessment & Plan Note (Signed)
Check urine for infection and blood

## 2016-10-18 NOTE — Assessment & Plan Note (Signed)
Dangerous to take more Aleve than recommended; will check kidneys and liver today; also, not sure this isn't a misunderstanding with neurosurgeon or neurologist, because my training would say only tylenol after stroke; will contact them and double-check; in the meantime, OTC only as exactly recommended on the bottle

## 2016-10-19 ENCOUNTER — Telehealth: Payer: Self-pay | Admitting: Family Medicine

## 2016-10-19 LAB — WET PREP BY MOLECULAR PROBE
Candida species: DETECTED — AB
Gardnerella vaginalis: DETECTED — AB
Trichomonas vaginosis: NOT DETECTED

## 2016-10-19 MED ORDER — METRONIDAZOLE 500 MG PO TABS: 500.0000 mg | ORAL_TABLET | Freq: Two times a day (BID) | ORAL | 0 refills | Status: DC

## 2016-10-19 MED ORDER — METRONIDAZOLE 500 MG PO TABS: 500.0000 mg | ORAL_TABLET | Freq: Two times a day (BID) | ORAL | 0 refills | Status: AC

## 2016-10-19 MED ORDER — FLUCONAZOLE 150 MG PO TABS
150.0000 mg | ORAL_TABLET | Freq: Once | ORAL | 0 refills | Status: AC
Start: 2016-10-19 — End: 2016-10-19

## 2016-10-19 MED ORDER — FLUCONAZOLE 150 MG PO TABS: 150.0000 mg | ORAL_TABLET | Freq: Once | ORAL | 0 refills | Status: DC

## 2016-10-19 NOTE — Telephone Encounter (Signed)
Patient has yeast and BV Warned about alcohol and metronidazole Reviewed other labs Call if sx don't improve

## 2016-10-21 NOTE — Assessment & Plan Note (Signed)
I explained to patient that I would have thought that neurology/neurosurgery would not want her taking four aleve at a time; advised that is too much; explained max dosing; also explained that there is risk associated with NSAIDs and that perhaps they meant for her to use acetaminophen instead of NSAID, given her hx; message left by my CMA at their office; patient was instructed to follow-up and call her specialist(s) to find out what is safe for her to take; always follow package directions

## 2016-10-24 ENCOUNTER — Telehealth: Payer: Self-pay | Admitting: Family Medicine

## 2016-10-24 NOTE — Telephone Encounter (Signed)
Called left voicemail ...

## 2016-10-24 NOTE — Telephone Encounter (Signed)
Please return call. Pt was prescribed 3 different medications last week and as of today she is in a lot of pain. I advised pt to go to urgent care or ER due to us not having no availability and because Dr Sherie DonLada is about to leave.  [phone]

## 2016-10-24 NOTE — Telephone Encounter (Signed)
Talked with patient she states finished antibiotic for UTI was feeling better but flank pain has come back.  I told her you were gone for the day but if she was in severe pain to go to urgent care or ER.  She is also scheduled for tomorrow for f/u.

## 2016-10-25 ENCOUNTER — Ambulatory Visit: Payer: Self-pay | Admitting: Family Medicine

## 2016-11-19 ENCOUNTER — Ambulatory Visit (INDEPENDENT_AMBULATORY_CARE_PROVIDER_SITE_OTHER): Payer: BLUE CROSS/BLUE SHIELD | Admitting: Family Medicine

## 2016-11-19 ENCOUNTER — Encounter: Payer: Self-pay | Admitting: Family Medicine

## 2016-11-19 VITALS — BP 110/72 | HR 88 | Temp 98.4°F | Resp 16 | Wt 139.7 lb

## 2016-11-19 DIAGNOSIS — R8281 Pyuria: Secondary | ICD-10-CM

## 2016-11-19 DIAGNOSIS — N390 Urinary tract infection, site not specified: Secondary | ICD-10-CM | POA: Diagnosis not present

## 2016-11-19 DIAGNOSIS — Z87442 Personal history of urinary calculi: Secondary | ICD-10-CM | POA: Diagnosis not present

## 2016-11-19 DIAGNOSIS — N898 Other specified noninflammatory disorders of vagina: Secondary | ICD-10-CM | POA: Diagnosis not present

## 2016-11-19 DIAGNOSIS — R109 Unspecified abdominal pain: Secondary | ICD-10-CM | POA: Diagnosis not present

## 2016-11-19 LAB — POCT URINALYSIS DIPSTICK
BILIRUBIN UA: NEGATIVE
Glucose, UA: NEGATIVE
Ketones, UA: NEGATIVE
NITRITE UA: NEGATIVE
PH UA: 5.5 (ref 5.0–8.0)
Protein, UA: NEGATIVE
RBC UA: NEGATIVE
SPEC GRAV UA: 1.02 (ref 1.030–1.035)
UROBILINOGEN UA: 0.2 (ref ?–2.0)

## 2016-11-19 MED ORDER — FLUCONAZOLE 150 MG PO TABS: 150.0000 mg | ORAL_TABLET | Freq: Once | ORAL | 0 refills | Status: AC

## 2016-11-19 NOTE — Patient Instructions (Addendum)
We'll get a scan of your abdomen and pelvis Start the fluconazole Try sitz baths Call if not improving How to Take a Sitz Bath A sitz bath is a warm water bath that is taken while you are sitting down. The water should only come up to your hips and should cover your buttocks. Your health care provider may recommend a sitz bath to help you:  Clean the lower part of your body, including your genital area.  With itching.  With pain.  With sore muscles or muscles that tighten or spasm. How to take a sitz bath Take 3-4 sitz baths per day or as told by your health care provider. 1. Partially fill a bathtub with warm water. You will only need the water to be deep enough to cover your hips and buttocks when you are sitting in it. 2. If your health care provider told you to put medicine in the water, follow the directions exactly. 3. Sit in the water and open the tub drain a little. 4. Turn on the warm water again to keep the tub at the correct level. Keep the water running constantly. 5. Soak in the water for 15-20 minutes or as told by your health care provider. 6. After the sitz bath, pat the affected area dry first. Do not rub it. 7. Be careful when you stand up after the sitz bath because you may feel dizzy. Contact a health care provider if:  Your symptoms get worse. Do not continue with sitz baths if your symptoms get worse.  You have new symptoms. Do not continue with sitz baths until you talk with your health care provider. This information is not intended to replace advice given to you by your health care provider. Make sure you discuss any questions you have with your health care provider. Document Released: 04/21/2004 Document Revised: 12/28/2015 Document Reviewed: 07/28/2014 Elsevier Interactive Patient Education  2017 [employer].

## 2016-11-19 NOTE — Assessment & Plan Note (Signed)
Check CT scan 

## 2016-11-19 NOTE — Progress Notes (Signed)
BP 110/72   Pulse 88   Temp 98.4 F (36.9 C) (Oral)   Resp 16   Wt 139 lb 11.2 oz (63.4 kg)   LMP 11/07/2016   SpO2 98%   BMI 24.75 kg/m    Subjective:    Patient ID: Krystal Crane, female    DOB: May 19, 1976, 41 y.o.   MRN: [id number]  HPI: Krystal Crane is a 41 y.o. female  Chief Complaint  Patient presents with  . Follow-up  . Vaginal Discharge    patient states vagina feels swollen and painful it did not clear up since last visit   Patient is having vaginal discharge, thick and sticky, white and creamy Vaginal tissues feel swollen; not sexually active now; no burning with urination No lower abdominal pain Still having flank pain Vaginal swelling and symptoms never cleared up after last visit Hx of kidney stones; still having left sided flank pain; no fevers; she is interested in a scan, bothering her for several weeks; no visible blood in urine  Depression screen [facility] 2/9 11/19/2016 10/18/2016 01/27/2016 12/09/2015 07/18/2015  Decreased Interest 0 0 0 0 0  Down, Depressed, Hopeless 1 1 0 0 2  PHQ - 2 Score 1 1 0 0 2  Altered sleeping - - - - 3  Tired, decreased energy - - - - 3  Change in appetite - - - - 1  Feeling bad or failure about yourself  - - - - 1  Trouble concentrating - - - - 2  Moving slowly or fidgety/restless - - - - 1  Suicidal thoughts - - - - 0  PHQ-9 Score - - - - 13   Relevant past medical, surgical, family and social history reviewed Past Medical History:  Diagnosis Date  . Anxiety   . Depression   . Hyperlipidemia   . Recurrent urinary tract infection 12/09/2015   Family History  Problem Relation Age of Onset  . Hypertension Mother   . Stroke Mother   . Liver disease Mother   . Hyperlipidemia Father   . Hypertension Father   . Stroke Maternal Grandmother   . Cancer Paternal Grandfather    Social History  Substance Use Topics  . Smoking status: Never Smoker  . Smokeless tobacco: Never Used  . Alcohol use No     Comment: social    Interim medical history since last visit reviewed. Allergies and medications reviewed  Review of Systems Per HPI unless specifically indicated above     Objective:    BP 110/72   Pulse 88   Temp 98.4 F (36.9 C) (Oral)   Resp 16   Wt 139 lb 11.2 oz (63.4 kg)   LMP 11/07/2016   SpO2 98%   BMI 24.75 kg/m   Wt Readings from Last 3 Encounters:  11/19/16 139 lb 11.2 oz (63.4 kg)  10/18/16 136 lb 11.2 oz (62 kg)  05/10/16 165 lb 11.2 oz (75.2 kg)    Physical Exam  Constitutional: She appears well-developed and well-nourished. No distress.  Weight loss noted; not cachectic  Cardiovascular: Normal rate and regular rhythm.   Pulmonary/Chest: Effort normal and breath sounds normal.  Abdominal: Soft. Bowel sounds are normal. She exhibits no distension. There is no tenderness. There is no guarding and no CVA tenderness.  Genitourinary: No erythema, tenderness or bleeding in the vagina. No signs of injury around the vagina. Vaginal discharge (white, clumps) found.  Skin: She is not diaphoretic. No pallor.  Psychiatric:  Good  eye contact, but rather flat affect   Results for orders placed or performed in visit on 11/19/16  POCT Urinalysis Dipstick  Result Value Ref Range   Color, UA yellow    Clarity, UA clear    Glucose, UA neg    Bilirubin, UA neg    Ketones, UA neg    Spec Grav, UA 1.020 1.030 - 1.035   Blood, UA neg    pH, UA 5.5 5.0 - 8.0   Protein, UA neg    Urobilinogen, UA 0.2 Negative - 2.0   Nitrite, UA neg    Leukocytes, UA large (3+) (A) Negative      Assessment & Plan:   Problem List Items Addressed This Visit      Other   History of kidney stones    Check CT scan      Relevant Orders   CT ABDOMEN PELVIS WO CONTRAST    Other Visit Diagnoses    Pyuria    -  Primary   urine culture ordered; not sure if vaginitis, pt denies dysuria, so will wait on culture   Relevant Orders   POCT Urinalysis Dipstick (Completed)   Urine Culture   CT ABDOMEN PELVIS  WO CONTRAST   Vaginal discharge       wet mount collected; start fluconazole today and may repeat in 3 days if needed   Relevant Orders   WET PREP BY MOLECULAR PROBE   Acute left flank pain       hx of kidney stones, will get CT scan to evaluate for stone   Relevant Orders   CT ABDOMEN PELVIS WO CONTRAST       Follow up plan: No Follow-up on file.  An after-visit summary was printed and given to the patient at check-out.  Please see the patient instructions which may contain other information and recommendations beyond what is mentioned above in the assessment and plan.  Meds ordered this encounter  Medications  . vortioxetine HBr (TRINTELLIX) 10 MG TABS    Sig: Take 10 mg by mouth daily.  . fluconazole (DIFLUCAN) 150 MG tablet    Sig: Take 1 tablet (150 mg total) by mouth once. Followed by 2nd pill 3 days later if needed    Dispense:  2 tablet    Refill:  0    Orders Placed This Encounter  Procedures  . WET PREP BY MOLECULAR PROBE  . Urine Culture  . CT ABDOMEN PELVIS WO CONTRAST  . POCT Urinalysis Dipstick

## 2016-11-20 ENCOUNTER — Telehealth: Payer: Self-pay

## 2016-11-20 LAB — WET PREP BY MOLECULAR PROBE
Candida species: DETECTED — AB
Gardnerella vaginalis: DETECTED — AB
TRICHOMONAS VAG: NOT DETECTED

## 2016-11-20 LAB — URINE CULTURE: ORGANISM ID, BACTERIA: NO GROWTH

## 2016-11-20 NOTE — Telephone Encounter (Signed)
Patient was informed that she has been scheduled to have her CT on Tuesday, Krystal Crane 17, 2018 @ 10am. She was told that she cannot have anything to eat or drink 4 hours prior to imaging and to arrive 15 mins early. Patient was also instructed to pick up a prep kit prior to her imaging appt.

## 2016-11-21 ENCOUNTER — Other Ambulatory Visit: Payer: Self-pay | Admitting: Family Medicine

## 2016-11-21 MED ORDER — CLINDAMYCIN PHOSPHATE 2 % VA CREA
1.0000 | TOPICAL_CREAM | Freq: Every day | VAGINAL | 0 refills | Status: DC
Start: 2016-11-21 — End: 2017-07-26

## 2016-11-21 NOTE — Progress Notes (Signed)
rx sent to pharmacy

## 2016-11-23 ENCOUNTER — Other Ambulatory Visit: Payer: Self-pay

## 2016-11-27 ENCOUNTER — Ambulatory Visit
Admission: RE | Admit: 2016-11-27 | Discharge: 2016-11-27 | Disposition: A | Discharge status: Home / Self Care | Payer: BLUE CROSS/BLUE SHIELD | Source: Ambulatory Visit | Attending: Family Medicine | Admitting: Family Medicine

## 2016-11-27 ENCOUNTER — Telehealth: Payer: Self-pay | Admitting: Family Medicine

## 2016-11-27 DIAGNOSIS — M47814 Spondylosis without myelopathy or radiculopathy, thoracic region: Secondary | ICD-10-CM | POA: Diagnosis not present

## 2016-11-27 DIAGNOSIS — Z87442 Personal history of urinary calculi: Secondary | ICD-10-CM | POA: Insufficient documentation

## 2016-11-27 DIAGNOSIS — Z9049 Acquired absence of other specified parts of digestive tract: Secondary | ICD-10-CM | POA: Insufficient documentation

## 2016-11-27 DIAGNOSIS — N390 Urinary tract infection, site not specified: Secondary | ICD-10-CM | POA: Diagnosis present

## 2016-11-27 DIAGNOSIS — R109 Unspecified abdominal pain: Secondary | ICD-10-CM | POA: Insufficient documentation

## 2016-11-27 DIAGNOSIS — N9489 Other specified conditions associated with female genital organs and menstrual cycle: Secondary | ICD-10-CM

## 2016-11-27 DIAGNOSIS — M2578 Osteophyte, vertebrae: Secondary | ICD-10-CM | POA: Diagnosis not present

## 2016-11-27 DIAGNOSIS — N200 Calculus of kidney: Secondary | ICD-10-CM | POA: Diagnosis not present

## 2016-11-27 DIAGNOSIS — M4186 Other forms of scoliosis, lumbar region: Secondary | ICD-10-CM | POA: Diagnosis not present

## 2016-11-27 DIAGNOSIS — Z975 Presence of (intrauterine) contraceptive device: Secondary | ICD-10-CM | POA: Diagnosis not present

## 2016-11-27 NOTE — Telephone Encounter (Signed)
I called patient about 4:40 pm; left message, explaining that if she gets this and can call back before 5 pm, we can go over her scan together I have not heard back, so I'll call her tomorrow

## 2016-11-28 ENCOUNTER — Other Ambulatory Visit: Payer: Self-pay

## 2016-11-28 ENCOUNTER — Encounter: Payer: Self-pay | Admitting: Family Medicine

## 2016-11-28 DIAGNOSIS — N83201 Unspecified ovarian cyst, right side: Secondary | ICD-10-CM

## 2016-11-28 DIAGNOSIS — N9489 Other specified conditions associated with female genital organs and menstrual cycle: Secondary | ICD-10-CM

## 2016-11-28 HISTORY — DX: Other specified conditions associated with female genital organs and menstrual cycle: N94.89

## 2016-11-28 NOTE — Assessment & Plan Note (Signed)
Discussed w/patient; refer to GYN

## 2016-11-28 NOTE — Telephone Encounter (Signed)
I talked with patient about her scan; will refer to GYN

## 2016-11-29 ENCOUNTER — Telehealth: Payer: Self-pay

## 2016-11-29 NOTE — Telephone Encounter (Signed)
Pt wanted to know the number reading of the cyst they found on her ovaries.  Mention to pt it was the right ovary and 5.5 cm. Pt understood.

## 2016-12-05 ENCOUNTER — Encounter: Payer: Self-pay | Admitting: Obstetrics and Gynecology

## 2016-12-05 ENCOUNTER — Ambulatory Visit (INDEPENDENT_AMBULATORY_CARE_PROVIDER_SITE_OTHER): Payer: BLUE CROSS/BLUE SHIELD | Admitting: Obstetrics and Gynecology

## 2016-12-05 VITALS — BP 110/66 | HR 97 | Ht 63.0 in | Wt 142.0 lb

## 2016-12-05 DIAGNOSIS — R109 Unspecified abdominal pain: Secondary | ICD-10-CM

## 2016-12-05 DIAGNOSIS — R102 Pelvic and perineal pain: Secondary | ICD-10-CM

## 2016-12-05 DIAGNOSIS — N83201 Unspecified ovarian cyst, right side: Secondary | ICD-10-CM

## 2016-12-05 LAB — POCT URINALYSIS DIPSTICK
BILIRUBIN UA: NEGATIVE
GLUCOSE UA: NEGATIVE
Ketones, UA: NEGATIVE
LEUKOCYTES UA: NEGATIVE
NITRITE UA: NEGATIVE
Protein, UA: NEGATIVE
RBC UA: NEGATIVE
Spec Grav, UA: 1.02 (ref 1.010–1.025)
UROBILINOGEN UA: NEGATIVE U/dL — AB
pH, UA: 7 (ref 5.0–8.0)

## 2016-12-05 MED ORDER — ESOMEPRAZOLE MAGNESIUM 40 MG PO CPDR: 40.0000 mg | DELAYED_RELEASE_CAPSULE | Freq: Every day | ORAL | 1 refills | Status: DC

## 2016-12-05 MED ORDER — SUCRALFATE 1 G PO TABS: 1.0000 g | ORAL_TABLET | Freq: Four times a day (QID) | ORAL | 1 refills | Status: DC

## 2016-12-05 NOTE — Progress Notes (Signed)
Obstetrics & Gynecology Office Visit   Chief Complaint:  Chief Complaint  Patient presents with  . Ovarian Cyst    Referred by [facility]    History of Present Illness: [name] is 41 year old [phone] presenting for consultation after imaging by her PCP revealed at 5cm right ovarian cyst on CT scan 11/27/16.  The imaging was obtained for evaluation of left flank pain.  She does have a history of nephrolithiasis and no obstructing stone or hydronephrosis was evidenced on imaging.  She has an paragard IUD in place.  She is currently undergoing a divorce, has been self treating with naproxen and heat to area with some benefit.  She has not noted any association with po intake, although she has not been eating as well since starting the divorce.  Bowl movement have remained unchanged, some mild constipation.  She was treated for UTI with initial onset of symptoms, she denies dysuria, frequency, or hematuria.       Review of Systems: Review of Systems  Constitutional: Positive for weight loss. Negative for chills, diaphoresis, fever and malaise/fatigue.  Gastrointestinal: Positive for abdominal pain and constipation. Negative for blood in stool, diarrhea, heartburn, melena, nausea and vomiting.  Genitourinary: Positive for flank pain. Negative for dysuria, frequency, hematuria and urgency.  Neurological: Negative for weakness.  Psychiatric/Behavioral: Positive for depression. Negative for hallucinations, memory loss, substance abuse and suicidal ideas. The patient is nervous/anxious and has insomnia.     Past Medical History:  Past Medical History:  Diagnosis Date  . Adnexal mass 11/28/2016   5.5 cm RIGHT adnexal mass; pain on left; refer to GYN  . Anxiety   . Depression   . Hyperlipidemia   . Recurrent urinary tract infection 12/09/2015    Past Surgical History:  Past Surgical History:  Procedure Laterality Date  . BRAIN SURGERY     bypass surgery  . CESAREAN SECTION  1998,  2004, 2009   3    Gynecologic History: Patient's last menstrual period was 11/26/2016 (exact date).  Obstetric History: [id number]  Family History:  Family History  Problem Relation Age of Onset  . Hypertension Mother   . Stroke Mother   . Liver disease Mother   . Hyperlipidemia Father   . Hypertension Father   . Stroke Maternal Grandmother   . Cancer Paternal Grandfather     Social History:  Social History   Social History  . Marital status: Legally Separated    Spouse name: N/A  . Number of children: N/A  . Years of education: N/A   Occupational History  . Not on file.   Social History Main Topics  . Smoking status: Never Smoker  . Smokeless tobacco: Never Used  . Alcohol use 0.0 oz/week     Comment: social  . Drug use: No  . Sexual activity: Not Currently    Partners: Male    Birth control/ protection: IUD   Other Topics Concern  . Not on file   Social History Narrative  . No narrative on file    Allergies:  Allergies  Allergen Reactions  . Levetiracetam Hives  . Ondansetron Nausea And Vomiting    Medications: Prior to Admission medications   Medication Sig Start Date End Date Taking? Authorizing Provider  ALPRAZolam (XANAX) 0.5 MG tablet TAKE 1/2-1 TAB BY MOUTH AS NEEDED FOR SEVERE ANXIETY 11/19/16   Historical Provider, MD  aspirin EC 325 MG tablet Take 325 mg by mouth. 12/14/11   Historical Provider, MD  chlorzoxazone (PARAFON) 500 MG tablet Take 1 tablet by mouth 4 (four) times daily as needed.    Historical Provider, MD  clindamycin (CLEOCIN) 2 % vaginal cream Place 1 Applicatorful vaginally at bedtime. 11/21/16   Kerman Passey, MD  esomeprazole (NEXIUM) 40 MG capsule Take 1 capsule (40 mg total) by mouth daily. 12/05/16 12/05/17  Vena Austria, MD  pregabalin (LYRICA) 50 MG capsule Take 50 mg by mouth daily. Work up to 6 tabs a day    Historical Provider, MD  QUDEXY XR 200 MG CS24 Take 1 capsule by mouth at bedtime. 09/08/15   Historical Provider, MD   sucralfate (CARAFATE) 1 g tablet Take 1 tablet (1 g total) by mouth 4 (four) times daily. 12/05/16 12/05/17  Vena Austria, MD  vortioxetine HBr (TRINTELLIX) 10 MG TABS Take 10 mg by mouth daily.    Historical Provider, MD    Physical Exam Vitals:  Vitals:   12/05/16 1613  BP: 110/66  Pulse: 97   Patient's last menstrual period was 11/26/2016 (exact date).  General: NAD HEENT: normocephalic, anicteric Thyroid: no enlargement, no palpable nodules Pulmonary: No increased work of breathing Cardiovascular: RRR, distal pulses 2+ Abdomen: NABS, soft, tenderness just below the left costophrenic angle, non-distended.  Umbilicus without lesions.  No hepatomegaly, splenomegaly or masses palpable. No evidence of hernia  Genitourinary:  External: Normal external female genitalia.  Normal urethral meatus, normal Bartholin's and Skene's glands.    Vagina: Normal vaginal mucosa, no evidence of prolapse.    Cervix: Grossly normal in appearance, no bleeding  Uterus: Non-enlarged, mobile, normal contour.  No CMT, slightly left of midline  Adnexa: Right adnexal fullness, no tenderness  Rectal: deferred  Lymphatic: no evidence of inguinal lymphadenopathy Extremities: no edema, erythema, or tenderness Neurologic: Grossly intact Psychiatric: mood appropriate, affect full  Female chaperone present for pelvicportions of the physical exam  Assessment: 41 y.o. [id number] ROV cyst, Left flank pain  Plan: Problem List Items Addressed This Visit    None    Visit Diagnoses    Pelvic pain    -  Primary   Relevant Orders   POCT urinalysis dipstick (Completed)   US Transvaginal Non-OB   Right ovarian cyst       Left flank pain          The area in question sits much higher than the ovary, the ovary is non-tender on exam, and the appearance of the cyst is simple.  I can reproduce pain in the upper abdomen on exam, she has been taking a fair bit of naproxen and I discussed possibility of a gastritis.   Will presumptively treat with sucralfate and nexium.  I will re-image the cyst this week to verify stable but would classify this as an asymptomatic cyst as she is non-tender over the cyst on exam.  There are no findings suggestive of malignancy on scan.  UA is negative today

## 2016-12-06 ENCOUNTER — Encounter: Payer: Self-pay | Admitting: Obstetrics and Gynecology

## 2016-12-07 ENCOUNTER — Ambulatory Visit (INDEPENDENT_AMBULATORY_CARE_PROVIDER_SITE_OTHER): Payer: BLUE CROSS/BLUE SHIELD

## 2016-12-07 ENCOUNTER — Encounter: Payer: Self-pay | Admitting: Obstetrics and Gynecology

## 2016-12-07 ENCOUNTER — Ambulatory Visit (INDEPENDENT_AMBULATORY_CARE_PROVIDER_SITE_OTHER): Payer: BLUE CROSS/BLUE SHIELD | Admitting: Obstetrics and Gynecology

## 2016-12-07 VITALS — BP 100/66 | HR 70 | Ht 63.0 in | Wt 140.0 lb

## 2016-12-07 DIAGNOSIS — R1012 Left upper quadrant pain: Secondary | ICD-10-CM | POA: Diagnosis not present

## 2016-12-07 DIAGNOSIS — R102 Pelvic and perineal pain: Secondary | ICD-10-CM

## 2016-12-07 NOTE — Progress Notes (Signed)
Gynecology Ultrasound Follow Up  Chief Complaint:  Chief Complaint  Patient presents with  . GYN U/S follow up     History of Present Illness: Patient is a 41 y.o. female who presents today for ultrasound evaluation of previously imaged simple right ovarian cyst.  She was also empirically started on carafate and nexium has not noted any improvement as of yet.  In addition she was asked to abstain from NSAID use which she has been compliant with.  Her LUQ pain remains unchanged.  Ultrasound demonstrates the following findgins Adnexa: ROV simple appearing cyst slightly smaller than on CT imaging measuring 3.5cm Uterus: Normal without myometrial or endometrial abnormalities Additional: Paragard IUD in proper position  Review of Systems: Review of Systems  Gastrointestinal: Positive for abdominal pain. Negative for blood in stool, constipation, diarrhea, heartburn, melena, nausea and vomiting.  Genitourinary: Positive for flank pain. Negative for dysuria, frequency, hematuria and urgency.    Past Medical History:  Past Medical History:  Diagnosis Date  . Adnexal mass 11/28/2016   5.5 cm RIGHT adnexal mass; pain on left; refer to GYN  . Anxiety   . Depression   . Hyperlipidemia   . Recurrent urinary tract infection 12/09/2015  . 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 BARIATRIC SURGERY  12/30/2012  . BRAIN SURGERY  03/2011   bypass surgery  . CESAREAN SECTION  1998, 2004, 2009   3  . CHOLECYSTECTOMY  2015   [facility]    Gynecologic History:  Patient's last menstrual period was 11/26/2016 (exact date). Contraception: IUD Last Pap results were: .no abnormalities  Family History:  Family History  Problem Relation Age of Onset  . Hypertension Mother   . Stroke Mother   . Liver disease Mother   . Hyperlipidemia Father   . Hypertension Father   . Stroke Maternal Grandmother     . Cancer Paternal Grandfather     Social History:  Social History   Social History  . Marital status: Legally Separated    Spouse name: N/A  . Number of children: N/A  . Years of education: N/A   Occupational History  . Not on file.   Social History Main Topics  . Smoking status: Never Smoker  . Smokeless tobacco: Never Used  . Alcohol use 0.0 oz/week     Comment: social  . Drug use: No  . Sexual activity: Not Currently    Partners: Male    Birth control/ protection: IUD   Other Topics Concern  . Not on file   Social History Narrative  . No narrative on file    Allergies:  Allergies  Allergen Reactions  . Levetiracetam Hives  . Ondansetron Nausea And Vomiting    Medications: Prior to Admission medications   Medication Sig Start Date End Date Taking? Authorizing Provider  ALPRAZolam (XANAX) 0.5 MG tablet TAKE 1/2-1 TAB BY MOUTH AS NEEDED FOR SEVERE ANXIETY 11/19/16   Historical Provider, MD  aspirin EC 325 MG tablet Take 325 mg by mouth. 12/14/11   Historical Provider, MD  chlorzoxazone (PARAFON) 500 MG tablet Take 1 tablet by mouth 4 (four) times daily as needed.    Historical Provider, MD  clindamycin (CLEOCIN) 2 % vaginal cream Place 1 Applicatorful vaginally at bedtime. 11/21/16   Kerman Passey, MD  esomeprazole (NEXIUM) 40 MG capsule Take 1 capsule (40 mg total) by mouth daily. 12/05/16  12/05/17  Vena Austria, MD  pregabalin (LYRICA) 50 MG capsule Take 50 mg by mouth daily. Work up to 6 tabs a day    Historical Provider, MD  QUDEXY XR 200 MG CS24 Take 1 capsule by mouth at bedtime. 09/08/15   Historical Provider, MD  sucralfate (CARAFATE) 1 g tablet Take 1 tablet (1 g total) by mouth 4 (four) times daily. 12/05/16 12/05/17  Vena Austria, MD  vortioxetine HBr (TRINTELLIX) 10 MG TABS Take 10 mg by mouth daily.    Historical Provider, MD    Physical Exam Vitals: Blood pressure 100/66, pulse 70, height  (1.6 m), weight 140 lb (63.5 kg), last menstrual  period 11/26/2016.  General: NAD HEENT: normocephalic, anicteric Pulmonary: No increased work of breathing Extremities: no edema, erythema, or tenderness Neurologic: Grossly intact, normal gait Psychiatric: mood appropriate, affect full   Assessment: 41 y.o. [id number] with LUQ pain, simple right ovarian cyst  Plan: Problem List Items Addressed This Visit    None    Visit Diagnoses    Abdominal pain, left upper quadrant    -  Primary   Relevant Orders   Lipase   CBC With Differential   Comprehensive metabolic panel   Ambulatory referral to Gastroenterology      1) LUQ abdominal pain - her cysts appears to be an incidental findings.  She was non-tender on palpation at prior visit and it appears to be decreasing in size, no evidence of torsion , laterality does not fit with symptoms.  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 - GI referral   2) ROV cyst - decreasing in size stable no further follow up warranted  3) A total of 15 minutes were spent in face-to-face contact with the patient during this encounter with over half of that time devoted to counseling and coordination of care.

## 2016-12-08 LAB — CBC WITH DIFFERENTIAL
BASOS ABS: 0 10*3/uL (ref 0.0–0.2)
Basos: 0 %
EOS (ABSOLUTE): 0.1 10*3/uL (ref 0.0–0.4)
Eos: 2 %
Hematocrit: 36.4 % (ref 34.0–46.6)
Hemoglobin: 11.9 g/dL (ref 11.1–15.9)
IMMATURE GRANS (ABS): 0 10*3/uL (ref 0.0–0.1)
Immature Granulocytes: 0 %
LYMPHS: 31 %
Lymphocytes Absolute: 2 10*3/uL (ref 0.7–3.1)
MCH: 27.8 pg (ref 26.6–33.0)
MCHC: 32.7 g/dL (ref 31.5–35.7)
MCV: 85 fL (ref 79–97)
Monocytes Absolute: 0.5 10*3/uL (ref 0.1–0.9)
Monocytes: 7 %
NEUTROS ABS: 3.9 10*3/uL (ref 1.4–7.0)
NEUTROS PCT: 60 %
RBC: 4.28 x10E6/uL (ref 3.77–5.28)
RDW: 14.9 % (ref 12.3–15.4)
WBC: 6.5 10*3/uL (ref 3.4–10.8)

## 2016-12-08 LAB — LIPASE: Lipase: 19 U/L (ref 14–72)

## 2016-12-08 LAB — COMPREHENSIVE METABOLIC PANEL
ALK PHOS: 73 IU/L (ref 39–117)
ALT: 7 IU/L (ref 0–32)
AST: 11 IU/L (ref 0–40)
Albumin/Globulin Ratio: 1.9 (ref 1.2–2.2)
Albumin: 4.1 g/dL (ref 3.5–5.5)
BILIRUBIN TOTAL: 0.3 mg/dL (ref 0.0–1.2)
BUN / CREAT RATIO: 15 (ref 9–23)
BUN: 10 mg/dL (ref 6–24)
CHLORIDE: 107 mmol/L — AB (ref 96–106)
CO2: 22 mmol/L (ref 18–29)
Calcium: 8.8 mg/dL (ref 8.7–10.2)
Creatinine, Ser: 0.65 mg/dL (ref 0.57–1.00)
GFR calc Af Amer: 127 mL/min/{1.73_m2} (ref >59)
GFR calc non Af Amer: 110 mL/min/{1.73_m2} (ref >59)
GLUCOSE: 77 mg/dL (ref 65–99)
Globulin, Total: 2.2 g/dL (ref 1.5–4.5)
Potassium: 4.4 mmol/L (ref 3.5–5.2)
SODIUM: 141 mmol/L (ref 134–144)
Total Protein: 6.3 g/dL (ref 6.0–8.5)

## 2017-01-09 ENCOUNTER — Other Ambulatory Visit: Payer: Self-pay | Admitting: Family Medicine

## 2017-02-10 ENCOUNTER — Telehealth: Payer: Self-pay | Admitting: Family Medicine

## 2017-02-10 NOTE — Telephone Encounter (Signed)
[facility] neuro note reviewed

## 2017-02-21 ENCOUNTER — Encounter: Payer: Self-pay | Admitting: Obstetrics and Gynecology

## 2017-02-21 ENCOUNTER — Ambulatory Visit (INDEPENDENT_AMBULATORY_CARE_PROVIDER_SITE_OTHER): Payer: BLUE CROSS/BLUE SHIELD | Admitting: Obstetrics and Gynecology

## 2017-02-21 VITALS — BP 100/60 | HR 70 | Ht 63.0 in | Wt 138.0 lb

## 2017-02-21 DIAGNOSIS — N3000 Acute cystitis without hematuria: Secondary | ICD-10-CM

## 2017-02-21 DIAGNOSIS — R8290 Unspecified abnormal findings in urine: Secondary | ICD-10-CM | POA: Diagnosis not present

## 2017-02-21 LAB — POCT URINALYSIS DIPSTICK
Bilirubin, UA: NEGATIVE
Glucose, UA: NEGATIVE
Ketones, UA: NEGATIVE
Leukocytes, UA: NEGATIVE
Nitrite, UA: NEGATIVE
PROTEIN UA: NEGATIVE
RBC UA: NEGATIVE
UROBILINOGEN UA: NEGATIVE U/dL

## 2017-02-21 MED ORDER — CIPROFLOXACIN HCL 500 MG PO TABS: 500.0000 mg | ORAL_TABLET | Freq: Two times a day (BID) | ORAL | 0 refills | Status: AC

## 2017-02-21 NOTE — Progress Notes (Signed)
Chief Complaint  Patient presents with  . Urinary Tract Infection    HPI:      [name] is a 41 y.o. [phone] who LMP was Patient's last menstrual period was 02/10/2017., presents today for sx of bad urine odor for the past 2 wks. She denies any vag sx, urin frequency, urgency, dysuria. She does have LT flank pain and LT LBP. No fevers. She has a hx of kidney stones but had a neg eval about 1 mo ago. She has a hx of postcoital UTIs and used to take macrobid, but pt no longer sex active since going through divorce. She had a neg urine culture 4/18.     Patient Active Problem List   Diagnosis Date Noted  . Adnexal mass 11/28/2016  . History of kidney stones 11/19/2016  . Medication monitoring encounter 10/18/2016  . Weight loss 10/18/2016  . Vitamin D deficiency 05/13/2016  . Overweight (BMI 25.0-29.9) 05/13/2016  . Recurrent urinary tract infection 12/09/2015  . 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 Benzodiazepine dependence, continuous (HCC) 05/10/2015  . Major depressive disorder, recurrent episode, in partial remission with anxious distress (HCC) 04/20/2015  . Cholelithiasis without obstruction 04/20/2015  . HLD (hyperlipidemia) 04/20/2015  . 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 Cerebral vascular accident (HCC) 04/20/2015  . Headache, tension-type 04/20/2015  . Basilar artery stenosis 04/20/2015  . Nodule of finger of left hand 04/20/2015  . Calculus of gallbladder 01/25/2014  . 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 Cephalalgia 04/21/2012  . 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 Hypertension Mother   . Stroke Mother   . Liver disease Mother   . Hyperlipidemia Father   . Hypertension Father   . Stroke Maternal Grandmother   . Cancer Paternal Grandfather     Social History   Social History  . Marital status: Legally Separated    Spouse name: N/A  . Number of children:  N/A  . Years of education: N/A   Occupational History  . Not on file.   Social History Main Topics  . Smoking status: Never Smoker  . Smokeless tobacco: Never Used  . Alcohol use 0.0 oz/week     Comment: social  . Drug use: No  . Sexual activity: Not Currently    Partners: Male    Birth control/ protection: IUD   Other Topics Concern  . Not on file   Social History Narrative  . No narrative on file     Current Outpatient Prescriptions:  .  ALPRAZolam (XANAX) 0.5 MG tablet, TAKE 1/2-1 TAB BY MOUTH AS NEEDED FOR SEVERE ANXIETY, Disp: , Rfl: 0 .  aspirin EC 325 MG tablet, Take 325 mg by mouth., Disp: , Rfl:  .  chlorzoxazone (PARAFON) 500 MG tablet, Take 1 tablet by mouth 4 (four) times daily as needed., Disp: , Rfl:  .  ciprofloxacin (CIPRO) 500 MG tablet, Take 1 tablet (500 mg total) by mouth 2 (two) times daily., Disp: 10 tablet, Rfl: 0 .  clindamycin (CLEOCIN) 2 % vaginal cream, Place 1 Applicatorful vaginally at bedtime., Disp: 40 g, Rfl: 0 .  esomeprazole (NEXIUM) 40 MG capsule, Take 1 capsule (40 mg total) by mouth daily., Disp: 30 capsule, Rfl: 1 .  nitrofurantoin (MACRODANTIN) 50 MG capsule, TAKE ONE CAPSULE BY MOUTH AFTER INTAMICY TO HELP PREVENT BLADDER INFECTIONS, Disp: 30 capsule, Rfl: 0 .  pregabalin (LYRICA) 50 MG capsule, Take 50  mg by mouth daily. Work up to 6 tabs a day, Disp: , Rfl:  .  QUDEXY XR 200 MG CS24, Take 1 capsule by mouth at bedtime., Disp: , Rfl: 3 .  sucralfate (CARAFATE) 1 g tablet, Take 1 tablet (1 g total) by mouth 4 (four) times daily., Disp: 120 tablet, Rfl: 1 .  vortioxetine HBr (TRINTELLIX) 10 MG TABS, Take 10 mg by mouth daily., Disp: , Rfl:   Review of Systems  Constitutional: Negative for fever.  Gastrointestinal: Negative for blood in stool, constipation, diarrhea, nausea and vomiting.  Genitourinary: Negative for dyspareunia, dysuria, flank pain, frequency, hematuria, urgency, vaginal bleeding, vaginal discharge and vaginal pain.    Musculoskeletal: Negative for back pain.  Skin: Negative for rash.     OBJECTIVE:   Vitals:  BP 100/60   Pulse 70   Ht 5\' 3"  (1.6 m)   Wt 138 lb (62.6 kg)   LMP 02/10/2017   BMI 24.45 kg/m   Physical Exam  Constitutional: She is oriented to person, place, and time and well-developed, well-nourished, and in no distress.  Abdominal: There is no CVA tenderness.  Neurological: She is alert and oriented to person, place, and time.  Psychiatric: Affect and judgment normal.  Vitals reviewed.   Results: Results for orders placed or performed in visit on 02/21/17 (from the past 24 hour(s))  POCT Urinalysis Dipstick     Status: Normal   Collection Time: 02/21/17  4:53 PM  Result Value Ref Range   Color, UA yellow    Clarity, UA     Glucose, UA neg    Bilirubin, UA neg    Ketones, UA neg    Spec Grav, UA  1.010 - 1.025   Blood, UA neg    pH, UA  5.0 - 8.0   Protein, UA neg    Urobilinogen, UA negative 0.2 or 1.0 E.U./dL   Nitrite, UA neg    Leukocytes, UA Negative Negative   ENTRY ERROR--THERE ARE POS NITRITES ON DIPSTICK  Assessment/Plan: Acute cystitis without hematuria - Pos dipstick. Check C&S. Rx cipro. WIll call pt with results. F/u sooner prn. - Plan: ciprofloxacin (CIPRO) 500 MG tablet, Urine Culture  Urine malodor - Plan: POCT Urinalysis Dipstick     Meds ordered this encounter  Medications  . ciprofloxacin (CIPRO) 500 MG tablet    Sig: Take 1 tablet (500 mg total) by mouth 2 (two) times daily.    Dispense:  10 tablet    Refill:  0      Return if symptoms worsen or fail to improve.  Payge Eppes B. Biran Mayberry, PA-C 02/21/2017 4:56 PM

## 2017-02-27 LAB — URINE CULTURE

## 2017-03-29 ENCOUNTER — Emergency Department: Payer: No Typology Code available for payment source

## 2017-03-29 ENCOUNTER — Emergency Department
Admission: EM | Admit: 2017-03-29 | Discharge: 2017-03-29 | Disposition: A | Discharge status: Home / Self Care | Payer: No Typology Code available for payment source | Attending: Emergency Medicine | Admitting: Emergency Medicine

## 2017-03-29 ENCOUNTER — Encounter: Payer: Self-pay | Admitting: Emergency Medicine

## 2017-03-29 DIAGNOSIS — Z8673 Personal history of transient ischemic attack (TIA), and cerebral infarction without residual deficits: Secondary | ICD-10-CM | POA: Insufficient documentation

## 2017-03-29 DIAGNOSIS — I675 Moyamoya disease: Secondary | ICD-10-CM | POA: Insufficient documentation

## 2017-03-29 DIAGNOSIS — F329 Major depressive disorder, single episode, unspecified: Secondary | ICD-10-CM | POA: Insufficient documentation

## 2017-03-29 DIAGNOSIS — R55 Syncope and collapse: Secondary | ICD-10-CM | POA: Insufficient documentation

## 2017-03-29 DIAGNOSIS — Z79899 Other long term (current) drug therapy: Secondary | ICD-10-CM | POA: Insufficient documentation

## 2017-03-29 LAB — BASIC METABOLIC PANEL
ANION GAP: 3 — AB (ref 5–15)
BUN: 12 mg/dL (ref 6–20)
CHLORIDE: 116 mmol/L — AB (ref 101–111)
CO2: 23 mmol/L (ref 22–32)
Calcium: 8.4 mg/dL — ABNORMAL LOW (ref 8.9–10.3)
Creatinine, Ser: 0.69 mg/dL (ref 0.44–1.00)
Glucose, Bld: 106 mg/dL — ABNORMAL HIGH (ref 65–99)
POTASSIUM: 3.5 mmol/L (ref 3.5–5.1)
SODIUM: 142 mmol/L (ref 135–145)

## 2017-03-29 LAB — CBC
HEMATOCRIT: 34.3 % — AB (ref 35.0–47.0)
Hemoglobin: 11.7 g/dL — ABNORMAL LOW (ref 12.0–16.0)
MCH: 28.7 pg (ref 26.0–34.0)
MCHC: 34 g/dL (ref 32.0–36.0)
MCV: 84.3 fL (ref 80.0–100.0)
Platelets: 219 10*3/uL (ref 150–440)
RBC: 4.07 MIL/uL (ref 3.80–5.20)
RDW: 15.6 % — AB (ref 11.5–14.5)
WBC: 6.9 10*3/uL (ref 3.6–11.0)

## 2017-03-29 LAB — TROPONIN I: Troponin I: <0.03 ng/mL (ref <0.03)

## 2017-03-29 MED ORDER — HYDROCODONE-ACETAMINOPHEN 5-325 MG PO TABS: 1.0000 | ORAL_TABLET | ORAL | 0 refills | Status: DC | PRN
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
## 2017-03-29 NOTE — Discharge Instructions (Signed)
As we discussed please take your pain medication as needed, as written. Return to the emergency department for any worsening chest pain, or fever develop any trouble breathing, nausea or any other symptom personally concerning to yourself.
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
## 2017-07-15 ENCOUNTER — Ambulatory Visit: Payer: Self-pay | Admitting: Family Medicine

## 2017-07-16 ENCOUNTER — Ambulatory Visit: Payer: Self-pay | Admitting: Family Medicine

## 2017-07-19 ENCOUNTER — Encounter: Payer: Self-pay | Admitting: Family Medicine

## 2017-07-19 ENCOUNTER — Ambulatory Visit (INDEPENDENT_AMBULATORY_CARE_PROVIDER_SITE_OTHER): Payer: BLUE CROSS/BLUE SHIELD | Admitting: Family Medicine

## 2017-07-19 ENCOUNTER — Other Ambulatory Visit: Payer: Self-pay | Admitting: Family Medicine

## 2017-07-19 VITALS — BP 102/82 | HR 84 | Temp 98.7°F | Resp 14 | Wt 140.4 lb

## 2017-07-19 DIAGNOSIS — R55 Syncope and collapse: Secondary | ICD-10-CM | POA: Insufficient documentation

## 2017-07-19 DIAGNOSIS — I471 Supraventricular tachycardia, unspecified: Secondary | ICD-10-CM | POA: Insufficient documentation

## 2017-07-19 DIAGNOSIS — E559 Vitamin D deficiency, unspecified: Secondary | ICD-10-CM

## 2017-07-19 DIAGNOSIS — D649 Anemia, unspecified: Secondary | ICD-10-CM

## 2017-07-19 DIAGNOSIS — Z23 Encounter for immunization: Secondary | ICD-10-CM

## 2017-07-19 NOTE — Assessment & Plan Note (Signed)
Refer to cardiologist; not sure if seizure or cardiac arrhythmia; do not drive because of risk of another event and possible injury or death

## 2017-07-19 NOTE — Patient Instructions (Signed)
We'll have you see a cardiologist very soon Do not drive because of the risk of another event which could lead to injury or death No baths or swimming Let's get labs today If you have not heard anything from my staff in five days about any orders/referrals/studies from today, please contact us here to follow-up [phone]

## 2017-07-19 NOTE — Assessment & Plan Note (Signed)
Check vit D, calcium, albumin, phosphorous

## 2017-07-19 NOTE — Progress Notes (Signed)
Recheck CBC in 6 weeks

## 2017-07-19 NOTE — Assessment & Plan Note (Signed)
Reviewed the Ziopatch readings; refer to cardiologist

## 2017-07-19 NOTE — Assessment & Plan Note (Signed)
Check level and supplement if needed 
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
## 2017-07-19 NOTE — Assessment & Plan Note (Signed)
Check CBC in 6 weeks

## 2017-07-20 ENCOUNTER — Other Ambulatory Visit: Payer: Self-pay | Admitting: Family Medicine

## 2017-07-20 LAB — COMPLETE METABOLIC PANEL WITH GFR
AG RATIO: 1.5 (calc) (ref 1.0–2.5)
ALKALINE PHOSPHATASE (APISO): 75 U/L (ref 33–115)
ALT: 11 U/L (ref 6–29)
AST: 21 U/L (ref 10–30)
Albumin: 3.9 g/dL (ref 3.6–5.1)
BILIRUBIN TOTAL: 0.3 mg/dL (ref 0.2–1.2)
BUN: 17 mg/dL (ref 7–25)
CHLORIDE: 112 mmol/L — AB (ref 98–110)
CO2: 23 mmol/L (ref 20–32)
Calcium: 8.6 mg/dL (ref 8.6–10.2)
Creat: 0.66 mg/dL (ref 0.50–1.10)
GFR, EST AFRICAN AMERICAN: 125 mL/min/{1.73_m2} (ref >=60)
GFR, Est Non African American: 108 mL/min/{1.73_m2} (ref >=60)
GLUCOSE: 77 mg/dL (ref 65–139)
Globulin: 2.6 g/dL (calc) (ref 1.9–3.7)
POTASSIUM: 3.9 mmol/L (ref 3.5–5.3)
Sodium: 142 mmol/L (ref 135–146)
TOTAL PROTEIN: 6.5 g/dL (ref 6.1–8.1)

## 2017-07-20 LAB — CBC WITH DIFFERENTIAL/PLATELET
BASOS ABS: 28 {cells}/uL (ref 0–200)
Basophils Relative: 0.5 %
Eosinophils Absolute: 99 cells/uL (ref 15–500)
Eosinophils Relative: 1.8 %
HCT: 35.2 % (ref 35.0–45.0)
HEMOGLOBIN: 11.5 g/dL — AB (ref 11.7–15.5)
Lymphs Abs: 1601 cells/uL (ref 850–3900)
MCH: 27.5 pg (ref 27.0–33.0)
MCHC: 32.7 g/dL (ref 32.0–36.0)
MCV: 84.2 fL (ref 80.0–100.0)
MONOS PCT: 7.6 %
MPV: 11.1 fL (ref 7.5–12.5)
NEUTROS ABS: 3355 {cells}/uL (ref 1500–7800)
Neutrophils Relative %: 61 %
Platelets: 253 10*3/uL (ref 140–400)
RBC: 4.18 10*6/uL (ref 3.80–5.10)
RDW: 13.6 % (ref 11.0–15.0)
Total Lymphocyte: 29.1 %
WBC mixed population: 418 cells/uL (ref 200–950)
WBC: 5.5 10*3/uL (ref 3.8–10.8)

## 2017-07-20 LAB — TSH: TSH: 0.95 mIU/L

## 2017-07-20 LAB — VITAMIN D 25 HYDROXY (VIT D DEFICIENCY, FRACTURES): VIT D 25 HYDROXY: 14 ng/mL — AB (ref 30–100)

## 2017-07-20 LAB — MAGNESIUM: MAGNESIUM: 2.1 mg/dL (ref 1.5–2.5)

## 2017-07-20 LAB — PHOSPHORUS: PHOSPHORUS: 3.1 mg/dL (ref 2.5–4.5)

## 2017-07-20 MED ORDER — VITAMIN D (ERGOCALCIFEROL) 1.25 MG (50000 UNIT) PO CAPS: 50000.0000 [IU] | ORAL_CAPSULE | ORAL | 1 refills | Status: DC

## 2017-07-20 NOTE — Progress Notes (Signed)
Start vit D rx 

## 2017-07-23 ENCOUNTER — Ambulatory Visit: Payer: BLUE CROSS/BLUE SHIELD | Admitting: Obstetrics and Gynecology

## 2017-07-25 ENCOUNTER — Encounter: Payer: Self-pay | Admitting: Family Medicine
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
## 2017-07-26 ENCOUNTER — Ambulatory Visit (INDEPENDENT_AMBULATORY_CARE_PROVIDER_SITE_OTHER): Payer: BLUE CROSS/BLUE SHIELD | Admitting: Cardiovascular Disease

## 2017-07-26 ENCOUNTER — Encounter: Payer: Self-pay | Admitting: Cardiovascular Disease

## 2017-07-26 VITALS — BP 90/62 | HR 54 | Ht 64.0 in | Wt 139.8 lb

## 2017-07-26 DIAGNOSIS — I959 Hypotension, unspecified: Secondary | ICD-10-CM | POA: Diagnosis not present

## 2017-07-26 DIAGNOSIS — I471 Supraventricular tachycardia: Secondary | ICD-10-CM | POA: Diagnosis not present

## 2017-07-26 DIAGNOSIS — R55 Syncope and collapse: Secondary | ICD-10-CM | POA: Diagnosis not present

## 2017-07-26 DIAGNOSIS — I63513 Cerebral infarction due to unspecified occlusion or stenosis of bilateral middle cerebral arteries: Secondary | ICD-10-CM

## 2017-07-26 DIAGNOSIS — I675 Moyamoya disease: Secondary | ICD-10-CM | POA: Diagnosis not present

## 2017-07-26 NOTE — Patient Instructions (Signed)
Medication Instructions:   No medication changes made  Labwork:  No new labs needed  Testing/Procedures:  No further testing at this time   Follow-Up: It was a pleasure seeing you in the office today. Please call us if you have new issues that need to be addressed before your next appt.  [phone]  Your physician wants you to follow-up in:  As needed  If you need a refill on your cardiac medications before your next appointment, please call your pharmacy.     

## 2017-07-31 ENCOUNTER — Ambulatory Visit (INDEPENDENT_AMBULATORY_CARE_PROVIDER_SITE_OTHER): Payer: BLUE CROSS/BLUE SHIELD | Admitting: Obstetrics and Gynecology

## 2017-07-31 ENCOUNTER — Encounter: Payer: Self-pay | Admitting: Obstetrics and Gynecology

## 2017-07-31 VITALS — BP 114/78 | HR 73 | Ht 64.0 in | Wt 138.0 lb

## 2017-07-31 DIAGNOSIS — Z1231 Encounter for screening mammogram for malignant neoplasm of breast: Secondary | ICD-10-CM | POA: Diagnosis not present

## 2017-07-31 DIAGNOSIS — N760 Acute vaginitis: Secondary | ICD-10-CM

## 2017-07-31 DIAGNOSIS — B9689 Other specified bacterial agents as the cause of diseases classified elsewhere: Secondary | ICD-10-CM

## 2017-07-31 DIAGNOSIS — Z1239 Encounter for other screening for malignant neoplasm of breast: Secondary | ICD-10-CM

## 2017-07-31 MED ORDER — METRONIDAZOLE 500 MG PO TABS: 500.0000 mg | ORAL_TABLET | Freq: Two times a day (BID) | ORAL | 0 refills | Status: AC

## 2017-07-31 NOTE — Patient Instructions (Signed)
Bacterial Vaginosis Bacterial vaginosis is a vaginal infection that occurs when the normal balance of bacteria in the vagina is disrupted. It results from an overgrowth of certain bacteria. This is the most common vaginal infection among women ages 15-44. Because bacterial vaginosis increases your risk for STIs (sexually transmitted infections), getting treated can help reduce your risk for chlamydia, gonorrhea, herpes, and HIV (human immunodeficiency virus). Treatment is also important for preventing complications in pregnant women, because this condition can cause an early (premature) delivery. What are the causes? This condition is caused by an increase in harmful bacteria that are normally present in small amounts in the vagina. However, the reason that the condition develops is not fully understood. What increases the risk? The following factors may make you more likely to develop this condition:  Having a new sexual partner or multiple sexual partners.  Having unprotected sex.  Douching.  Having an intrauterine device (IUD).  Smoking.  Drug and alcohol abuse.  Taking certain antibiotic medicines.  Being pregnant.  You cannot get bacterial vaginosis from toilet seats, bedding, swimming pools, or contact with objects around you. What are the signs or symptoms? Symptoms of this condition include:  Grey or white vaginal discharge. The discharge can also be watery or foamy.  A fish-like odor with discharge, especially after sexual intercourse or during menstruation.  Itching in and around the vagina.  Burning or pain with urination.  Some women with bacterial vaginosis have no signs or symptoms. How is this diagnosed? This condition is diagnosed based on:  Your medical history.  A physical exam of the vagina.  Testing a sample of vaginal fluid under a microscope to look for a large amount of bad bacteria or abnormal cells. Your health care provider may use a cotton swab  or a small wooden spatula to collect the sample.  How is this treated? This condition is treated with antibiotics. These may be given as a pill, a vaginal cream, or a medicine that is put into the vagina (suppository). If the condition comes back after treatment, a second round of antibiotics may be needed. Follow these instructions at home: Medicines  Take over-the-counter and prescription medicines only as told by your health care provider.  Take or use your antibiotic as told by your health care provider. Do not stop taking or using the antibiotic even if you start to feel better. General instructions  If you have a female sexual partner, tell her that you have a vaginal infection. She should see her health care provider and be treated if she has symptoms. If you have a female sexual partner, he does not need treatment.  During treatment: ? Avoid sexual activity until you finish treatment. ? Do not douche. ? Avoid alcohol as directed by your health care provider. ? Avoid breastfeeding as directed by your health care provider.  Drink enough water and fluids to keep your urine clear or pale yellow.  Keep the area around your vagina and rectum clean. ? Wash the area daily with warm water. ? Wipe yourself from front to back after using the toilet.  Keep all follow-up visits as told by your health care provider. This is important. How is this prevented?  Do not douche.  Wash the outside of your vagina with warm water only.  Use protection when having sex. This includes latex condoms and dental dams.  Limit how many sexual partners you have. To help prevent bacterial vaginosis, it is best to have sex with just   one partner (monogamous).  Make sure you and your sexual partner are tested for STIs.  Wear cotton or cotton-lined underwear.  Avoid wearing tight pants and pantyhose, especially during summer.  Limit the amount of alcohol that you drink.  Do not use any products that  contain nicotine or tobacco, such as cigarettes and e-cigarettes. If you need help quitting, ask your health care provider.  Do not use illegal drugs. Where to find more information:  Centers for Disease Control and Prevention: www.cdc.gov/std  American Sexual Health Association (ASHA): www.ashastd.org  U.[place]. Department of Health and Human Services, Office on Women's Health: www.womenshealth.gov/ or https://www.womenshealth.gov/a-z-topics/bacterial-vaginosis Contact a health care provider if:  Your symptoms do not improve, even after treatment.  You have more discharge or pain when urinating.  You have a fever.  You have pain in your abdomen.  You have pain during sex.  You have vaginal bleeding between periods. Summary  Bacterial vaginosis is a vaginal infection that occurs when the normal balance of bacteria in the vagina is disrupted.  Because bacterial vaginosis increases your risk for STIs (sexually transmitted infections), getting treated can help reduce your risk for chlamydia, gonorrhea, herpes, and HIV (human immunodeficiency virus). Treatment is also important for preventing complications in pregnant women, because the condition can cause an early (premature) delivery.  This condition is treated with antibiotic medicines. These may be given as a pill, a vaginal cream, or a medicine that is put into the vagina (suppository). This information is not intended to replace advice given to you by your health care provider. Make sure you discuss any questions you have with your health care provider. Document Released: 07/30/2005 Document Revised: 12/03/2016 Document Reviewed: 04/14/2016 Elsevier Interactive Patient Education  2018 Elsevier Inc.  

## 2017-07-31 NOTE — Progress Notes (Signed)
Obstetrics & Gynecology Office Visit   Chief Complaint:  Chief Complaint  Patient presents with  . Vaginitis    clear discharge w/bad odor    History of Present Illness: [name] is a 41 y.o. [id number] who LMP was Patient's last menstrual period was 07/22/2017., presents today for a problem visit.   Patient complains of an abnormal vaginal discharge for 1 week. Discharge described as: clear. Vaginal symptoms include odor.Vulvar symptoms include none.STI Risk: Very low risk of STD exposure.   Other associated symptoms: odor and discharge.Menstrual pattern: She had been bleeding regularly. Contraception: IUD.  She denies recent antibiotic exposure, denies changes in soaps, detergents coinciding with the onset of her symptoms.  She has not previously self treated or been under treatment by another provider for these symptoms.   Recurrent has had multiple episodes now in the past year.  Review of Systems: Review of Systems  Constitutional: Negative for fever.  Genitourinary: Negative for dysuria, flank pain, frequency, hematuria and urgency.  Skin: Negative for itching and rash.    Past Medical History:  Past Medical History:  Diagnosis Date  . Adnexal mass 11/28/2016   5.5 cm RIGHT adnexal mass; pain on left; refer to GYN  . Anxiety   . 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 Depression   . Hyperlipidemia   . Moyamoya disease   . Recurrent urinary tract infection 12/09/2015  . 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 BARIATRIC SURGERY  12/30/2012  . BRAIN SURGERY  03/2011   Cerebral bypass surgery  . CESAREAN SECTION  1998, 2004, 2009   3  . CHOLECYSTECTOMY  2015   [facility]    Gynecologic History: Patient's last menstrual period was 07/22/2017.  Obstetric History: [id number]  Family History:  Family History  Problem Relation Age of Onset  . Hypertension Mother    . Stroke Mother   . Liver disease Mother   . Hyperlipidemia Father   . Hypertension Father   . Stroke Maternal Grandmother   . Cancer Paternal Grandfather     Social History:  Social History   Socioeconomic History  . Marital status: Legally Separated    Spouse name: Not on file  . Number of children: Not on file  . Years of education: Not on file  . Highest education level: Not on file  Social Needs  . Financial resource strain: Not on file  . Food insecurity - worry: Not on file  . Food insecurity - inability: Not on file  . Transportation needs - medical: Not on file  . Transportation needs - non-medical: Not on file  Occupational History  . Not on file  Tobacco Use  . Smoking status: Never Smoker  . Smokeless tobacco: Never Used  Substance and Sexual Activity  . Alcohol use: No    Alcohol/week: 0.0 oz    Frequency: Never    Comment: social  . Drug use: No  . Sexual activity: Not Currently    Partners: Male    Birth control/protection: IUD  Other Topics Concern  . Not on file  Social History Narrative  . Not on file    Allergies:  Allergies  Allergen Reactions  . Levetiracetam Hives  . Ondansetron Nausea And Vomiting    Medications: Prior to Admission medications   Medication Sig Start Date End Date Taking? Authorizing Provider  aspirin EC 325 MG tablet Take 325 mg by mouth. 12/14/11   [provider]  ergocalciferol (VITAMIN D2) 50000 units capsule Take 50,000 Units by mouth once a week.    [provider]  escitalopram (LEXAPRO) 20 MG tablet Take 20 mg by mouth daily.    [provider]  QUDEXY XR 200 MG CS24 Take 1 capsule by mouth at bedtime. 09/08/15   [provider]    Physical Exam Vitals:  Vitals:   07/31/17 1357  BP: 114/78  Pulse: 73   Patient's last menstrual period was 07/22/2017.  General: NAD HEENT: normocephalic, anicteric Pulmonary: No increased work of breathing Genitourinary:  External:  Normal external female genitalia.  Normal urethral meatus, normal Bartholin's and Skene's glands.    Vagina: Normal vaginal mucosa, no evidence of prolapse.    Cervix: Grossly normal in appearance, no bleeding  Uterus: Non-enlarged, mobile, normal contour.  No CMT  Adnexa: ovaries non-enlarged, no adnexal masses  Rectal: deferred  Lymphatic: no evidence of inguinal lymphadenopathy Extremities: no edema, erythema, or tenderness Neurologic: Grossly intact Psychiatric: mood appropriate, affect full  Female chaperone present for pelvicportions of the physical exam  Wet Prep: PH: >4,5 Clue Cells: Positive Fungal elements: Negative Trichomonas: Negative   Amsel's Criteria Bacterial Vaginosis Clinical diagnosis required presence of 3 of the below 4:  1) Homogenous thin white discharge present 2) Presence of clue cells present 3) Vaginal pH >4.5 present 4) Positive whiff test present   Assessment: 41 y.o. [id number] with recurrent bacterial vaginosis  Plan: Problem List Items Addressed This Visit    None    Visit Diagnoses    Bacterial vaginosis    -  Primary   Relevant Medications   metroNIDAZOLE (FLAGYL) 500 MG tablet   Breast screening       Relevant Orders   MM DIGITAL SCREENING BILATERAL     - Rx flagyl, samples of hylafem - We discussed risk factors for development of bacterial vaginosis.  Has had several prior episodes.  The patient has no identifiable risk factors.  Will treat with metronidazole, and follow up treatment with hylafem probiotic.  If another episode should occur consider obtaining Nuswab. . A total of 15 minutes were spent in face-to-face contact with the patient during this encounter with over half of that time devoted to counseling and coordination of care. Return in about 6 months (around 01/29/2018) for annual.

## 2017-09-05 ENCOUNTER — Other Ambulatory Visit: Payer: Self-pay | Admitting: Family Medicine

## 2017-09-05 NOTE — Telephone Encounter (Signed)
Patient was supposed to take vit D Rx once a week for 8 weeks, then switch to 800 iu OTC No more prescription needed once she's taken the 8 pills

## 2017-10-01 NOTE — Telephone Encounter (Signed)
Routing History   Priority Sent On From To Message Type   07/25/2017 2:25 PM Esaw Knippel, Janit BernMelinda P, MD Marcos EkeGraves, Jamie, CMA    Comment: Can you check into this and respond to the patient? Thank you!   07/25/2017 2:06 PM Marcos EkeGraves, Jamie, CMA Kerman PasseyLada, Chavy Avera P, MD    07/25/2017 12:49 PM Mychart, Generic P [facility]   Created by   Mychart, Generic on 07/25/2017 12:49 PM     Please follow-up on this and contact the patient if still necessary Thank you

## 2017-10-02 ENCOUNTER — Ambulatory Visit: Payer: Self-pay | Admitting: Family Medicine

## 2017-10-02 NOTE — Telephone Encounter (Signed)
Patient has been seen per CMA

## 2017-10-04 ENCOUNTER — Encounter: Payer: Self-pay | Admitting: Family Medicine

## 2017-10-04 ENCOUNTER — Ambulatory Visit (INDEPENDENT_AMBULATORY_CARE_PROVIDER_SITE_OTHER): Payer: BLUE CROSS/BLUE SHIELD | Admitting: Family Medicine

## 2017-10-04 VITALS — BP 100/62 | HR 98 | Temp 97.7°F | Resp 16 | Wt 137.8 lb

## 2017-10-04 DIAGNOSIS — J029 Acute pharyngitis, unspecified: Secondary | ICD-10-CM | POA: Diagnosis not present

## 2017-10-04 LAB — POCT RAPID STREP A (OFFICE): Rapid Strep A Screen: NEGATIVE

## 2017-10-04 NOTE — Progress Notes (Signed)
BP 100/62   Pulse 98   Temp 97.7 F (36.5 C) (Oral)   Resp 16   Wt 137 lb 12.8 oz (62.5 kg)   SpO2 96%   BMI 23.65 kg/m    Subjective:    Patient ID: Krystal Crane, female    DOB: Mar 12, 1976, 42 y.o.   MRN: [id number]  HPI: Krystal Crane is a 42 y.o. female  Chief Complaint  Patient presents with  . Sore Throat    onset 1week reddness, sore, swollen no fever    HPI Last Saturday, throat was so sore, lymph nodes were killing me all weekend; hurt to touch Called after hours line on Sunday night to see about coming in on Monday; they said she could go to urgent care; couldn't come Thursday She did have a white spot on her throat; it was red and swollen Took halls cough drops and mucinex all week Pain is getting much better Neck is getting better (anterior) Yesterday was losing her voice, but swelling is still there No fever No rash She had been to doctor's office, 42 year old has walking pneumonia; taking care of her child  Depression screen [facility] 2/9 10/04/2017 07/19/2017 11/19/2016 10/18/2016 01/27/2016  Decreased Interest 0 0 0 0 0  Down, Depressed, Hopeless 0 0 1 1 0  PHQ - 2 Score 0 0 1 1 0  Altered sleeping - - - - -  Tired, decreased energy - - - - -  Change in appetite - - - - -  Feeling bad or failure about yourself  - - - - -  Trouble concentrating - - - - -  Moving slowly or fidgety/restless - - - - -  Suicidal thoughts - - - - -  PHQ-9 Score - - - - -    Relevant past medical, surgical, family and social history reviewed Past Medical History:  Diagnosis Date  . Adnexal mass 11/28/2016   5.5 cm RIGHT adnexal mass; pain on left; refer to GYN  . Anxiety   . 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 Depression   . Hyperlipidemia   . Moyamoya disease   . Recurrent urinary tract infection 12/09/2015  . Stroke (cerebrum) (HCC) 2012   Both MCA arteries blocked   Past Surgical History:  Procedure Laterality Date  . BARIATRIC SURGERY  12/30/2012  . BRAIN  SURGERY  03/2011   Cerebral bypass surgery  . CESAREAN SECTION  1998, 2004, 2009   3  . CHOLECYSTECTOMY  2015   [facility]   Family History  Problem Relation Age of Onset  . Hypertension Mother   . Stroke Mother   . Liver disease Mother   . Hyperlipidemia Father   . Hypertension Father   . Stroke Maternal Grandmother   . Cancer Paternal Grandfather    Social History   Tobacco Use  . Smoking status: Never Smoker  . Smokeless tobacco: Never Used  Substance Use Topics  . Alcohol use: No    Alcohol/week: 0.0 oz    Frequency: Never    Comment: social  . Drug use: No    Interim medical history since last visit reviewed. Allergies and medications reviewed  Review of Systems Per HPI unless specifically indicated above     Objective:    BP 100/62   Pulse 98   Temp 97.7 F (36.5 C) (Oral)   Resp 16   Wt 137 lb 12.8 oz (62.5 kg)   SpO2 96%  BMI 23.65 kg/m   Wt Readings from Last 3 Encounters:  10/04/17 137 lb 12.8 oz (62.5 kg)  07/31/17 138 lb (62.6 kg)  07/26/17 139 lb 12 oz (63.4 kg)    Physical Exam  Constitutional: She appears well-developed and well-nourished.  HENT:  Mouth/Throat: Mucous membranes are normal.  Eyes: EOM are normal. No scleral icterus.  Cardiovascular: Normal rate and regular rhythm.  Pulmonary/Chest: Effort normal and breath sounds normal.  Psychiatric: She has a normal mood and affect. Her behavior is normal.       Assessment & Plan:   Problem List Items Addressed This Visit    None    Visit Diagnoses    Sore throat    -  Primary   rapid strep negative; culture pending; rest, hydration, symptomatic treatment   Relevant Orders   POCT rapid strep A (Completed)   Culture, Group A Strep (Completed)       Follow up plan: No Follow-up on file.  An after-visit summary was printed and given to the patient at check-out.  Please see the patient instructions which may contain other information and recommendations beyond what is mentioned  above in the assessment and plan.  No orders of the defined types were placed in this encounter.   Orders Placed This Encounter  Procedures  . Culture, Group A Strep  . POCT rapid strep A

## 2017-10-04 NOTE — Patient Instructions (Addendum)
We'll see what the culture shows If you have not heard anything from my staff in a week about any orders/referrals/studies from today, please contact us here to follow-up [phone]   Sore Throat A sore throat is pain, burning, irritation, or scratchiness in the throat. When you have a sore throat, you may feel pain or tenderness in your throat when you swallow or talk. Many things can cause a sore throat, including:  An infection.  Seasonal allergies.  Dryness in the air.  Irritants, such as smoke or pollution.  Gastroesophageal reflux disease (GERD).  A tumor.  A sore throat is often the first sign of another sickness. It may happen with other symptoms, such as coughing, sneezing, fever, and swollen neck glands. Most sore throats go away without medical treatment. Follow these instructions at home:  Take over-the-counter medicines only as told by your health care provider.  Drink enough fluids to keep your urine clear or pale yellow.  Rest as needed.  To help with pain, try: ? Sipping warm liquids, such as broth, herbal tea, or warm water. ? Eating or drinking cold or frozen liquids, such as frozen ice pops. ? Gargling with a salt-water mixture 3-4 times a day or as needed. To make a salt-water mixture, completely dissolve -1 tsp of salt in 1 cup of warm water. ? Sucking on hard candy or throat lozenges. ? Putting a cool-mist humidifier in your bedroom at night to moisten the air. ? Sitting in the bathroom with the door closed for 5-10 minutes while you run hot water in the shower.  Do not use any tobacco products, such as cigarettes, chewing tobacco, and e-cigarettes. If you need help quitting, ask your health care provider. Contact a health care provider if:  You have a fever for more than 2-3 days.  You have symptoms that last (are persistent) for more than 2-3 days.  Your throat does not get better within 7 days.  You have a fever and your symptoms suddenly  get worse. Get help right away if:  You have difficulty breathing.  You cannot swallow fluids, soft foods, or your saliva.  You have increased swelling in your throat or neck.  You have persistent nausea and vomiting. This information is not intended to replace advice given to you by your health care provider. Make sure you discuss any questions you have with your health care provider. Document Released: 09/06/2004 Document Revised: 03/25/2016 Document Reviewed: 05/20/2015 Elsevier Interactive Patient Education  [employer]2018 Else[employer] Inc.

## 2017-10-07 LAB — CULTURE, GROUP A STREP
MICRO NUMBER: 90239188
SPECIMEN QUALITY: ADEQUATE

## 2017-10-23 ENCOUNTER — Telehealth: Payer: Self-pay

## 2017-10-23 DIAGNOSIS — E538 Deficiency of other specified B group vitamins: Secondary | ICD-10-CM | POA: Insufficient documentation

## 2017-10-23 DIAGNOSIS — E559 Vitamin D deficiency, unspecified: Secondary | ICD-10-CM

## 2017-10-23 NOTE — Telephone Encounter (Signed)
Left detailed voicemail  Copied from CRM [id number]. Topic: Appointment Scheduling - Scheduling Inquiry for Clinic >> Oct 23, 2017 10:48 AM Maia Pettiesrtiz, Kristie S wrote: Pt is stating she was supposed to come in for labs to recheck vit b12, vit d, iron, magnesium, etc. I do not see orders for these. Please advise pt.

## 2017-10-23 NOTE — Telephone Encounter (Signed)
There is an order for the CBC that was put in back in December (07/19/17) She doesn't need any other labs drawn Her hemoglobin was a little low, suggested rechecking that in 6 weeks (due 08/30/17) I told her to take vit D and b12 and folic acid but don't need to draw those if taking supplements Thank you

## 2017-10-30 ENCOUNTER — Ambulatory Visit (INDEPENDENT_AMBULATORY_CARE_PROVIDER_SITE_OTHER): Payer: BLUE CROSS/BLUE SHIELD | Admitting: Family Medicine

## 2017-10-30 ENCOUNTER — Encounter: Payer: Self-pay | Admitting: Family Medicine

## 2017-10-30 VITALS — BP 110/80 | HR 118 | Temp 98.4°F | Resp 16 | Ht 64.0 in | Wt 139.3 lb

## 2017-10-30 DIAGNOSIS — R059 Cough, unspecified: Secondary | ICD-10-CM

## 2017-10-30 DIAGNOSIS — J069 Acute upper respiratory infection, unspecified: Secondary | ICD-10-CM | POA: Diagnosis not present

## 2017-10-30 DIAGNOSIS — I889 Nonspecific lymphadenitis, unspecified: Secondary | ICD-10-CM | POA: Diagnosis not present

## 2017-10-30 DIAGNOSIS — R05 Cough: Secondary | ICD-10-CM

## 2017-10-30 DIAGNOSIS — J029 Acute pharyngitis, unspecified: Secondary | ICD-10-CM

## 2017-10-30 DIAGNOSIS — H9203 Otalgia, bilateral: Secondary | ICD-10-CM | POA: Diagnosis not present

## 2017-10-30 LAB — POCT RAPID STREP A (OFFICE): RAPID STREP A SCREEN: NEGATIVE

## 2017-10-30 MED ORDER — FLUTICASONE PROPIONATE 50 MCG/ACT NA SUSP: 2.0000 | Freq: Every day | NASAL | 6 refills | Status: AC

## 2017-10-30 MED ORDER — AMOXICILLIN-POT CLAVULANATE 875-125 MG PO TABS: 1.0000 | ORAL_TABLET | Freq: Two times a day (BID) | ORAL | 0 refills | Status: AC

## 2017-10-30 MED ORDER — MAGIC MOUTHWASH W/LIDOCAINE: 5.0000 mL | Freq: Three times a day (TID) | ORAL | 0 refills | Status: DC | PRN

## 2017-10-30 MED ORDER — BENZONATATE 200 MG PO CAPS: 200.0000 mg | ORAL_CAPSULE | Freq: Three times a day (TID) | ORAL | 0 refills | Status: DC | PRN

## 2017-10-30 NOTE — Progress Notes (Signed)
Name: Krystal Crane   MRN: [id number]    DOB: [birth year]   Date:10/30/2017       Progress Note  Subjective  Chief Complaint  Chief Complaint  Patient presents with  . Ear Pain  . Sore Throat    throat and lymph nodes feel swollen, bodyache    HPI    Patient endorses sore throat started 5 days ago and progressed. She then begin to have bilateral ear pain- worse on the left which was sore on the left sided down her neck. Patient endorses dry cough and chills, endorses body aches. Endorses hoarseness, nasal congestion when sleeping, pain with swallowing- relieved by cough drops and drinking hot drink. Denies rash, n/v/d, change in chronic headache,  Muffled sound, no eye changes. Patient sts throat is most annoying symptom. Has taken ibuprofen and OTC ear drops with some help.  Patient was seen last month for similar symptoms which completely resolved and then returned Friday.   Patient Active Problem List   Diagnosis Date Noted  . Vitamin B12 deficiency 10/23/2017  . Hypotension 07/26/2017  . Supraventricular tachycardia (HCC) 07/19/2017  . Syncopal episodes 07/19/2017  . Hypocalcemia 07/19/2017  . Anemia 07/19/2017  . Adnexal mass 11/28/2016  . History of kidney stones 11/19/2016  . Medication monitoring encounter 10/18/2016  . Weight loss 10/18/2016  . Vitamin D deficiency 05/13/2016  . Overweight (BMI 25.0-29.9) 05/13/2016  . Recurrent urinary tract infection 12/09/2015  . 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 Benzodiazepine dependence, continuous (HCC) 05/10/2015  . Major depressive disorder, recurrent episode, in partial remission with anxious distress (HCC) 04/20/2015  . Cholelithiasis without obstruction 04/20/2015  . HLD (hyperlipidemia) 04/20/2015  . 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 Cerebral vascular accident (HCC) 04/20/2015  . Headache, tension-type 04/20/2015  . Basilar artery stenosis 04/20/2015  . Nodule of finger of left hand 04/20/2015  . Calculus of gallbladder  01/25/2014  . 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 Cephalalgia 04/21/2012  . Diffuse cerebrovascular disease 02/28/2011    Social History   Tobacco Use  . Smoking status: Never Smoker  . Smokeless tobacco: Never Used  Substance Use Topics  . Alcohol use: No    Alcohol/week: 0.0 oz    Frequency: Never    Comment: social     Current Outpatient Medications:  .  aspirin EC 325 MG tablet, Take 325 mg by mouth., Disp: , Rfl:  .  cholecalciferol (VITAMIN D) 1000 units tablet, Take 1,000 Units by mouth daily., Disp: , Rfl:  .  ergocalciferol (VITAMIN D2) 50000 units capsule, Take 50,000 Units by mouth once a week., Disp: , Rfl:  .  escitalopram (LEXAPRO) 20 MG tablet, Take 20 mg by mouth daily., Disp: , Rfl:  .  QUDEXY XR 200 MG CS24, Take 1 capsule by mouth at bedtime., Disp: , Rfl: 3 .  amoxicillin-clavulanate (AUGMENTIN) 875-125 MG tablet, Take 1 tablet by mouth 2 (two) times daily for 10 days., Disp: 20 tablet, Rfl: 0 .  benzonatate (TESSALON) 200 MG capsule, Take 1 capsule (200 mg total) by mouth 3 (three) times daily as needed for cough., Disp: 20 capsule, Rfl: 0 .  fluticasone (FLONASE) 50 MCG/ACT nasal spray, Place 2 sprays into both nostrils daily., Disp: 16 g, Rfl: 6 .  magic mouthwash w/lidocaine SOLN, Take 5 mLs by mouth 3 (three) times daily as needed for mouth pain., Disp: 100 mL, Rfl: 0  Allergies  Allergen Reactions  . Levetiracetam Hives  . Ondansetron Nausea And  Vomiting    ROS  Constitutional: Positive Chills; Negative for fever or weight change.  Respiratory: Positive for cough and Negative shortness of breath.   Cardiovascular: Negative for chest pain or palpitations.  Gastrointestinal: Negative for abdominal pain, no bowel changes.  Musculoskeletal: Negative for gait problem or joint swelling.  Skin: Negative for rash.  Neurological: Negative for dizziness or headache.  No other specific complaints in a complete review  of systems (except as listed in HPI above).  Objective  Vitals:   10/30/17 0829 10/30/17 0923  BP: 110/80   Pulse: (!) 108 (!) 118  Resp: 16   Temp: 98.4 F (36.9 C)   TempSrc: Oral   SpO2: 94%   Weight: 139 lb 4.8 oz (63.2 kg)   Height: 5\' 4"  (1.626 m)     Body mass index is 23.91 kg/m.  Nursing Note and Vital Signs reviewed.  Physical Exam   Constitutional: Patient appears well-developed and well-nourished. No distress.  HEENT: head atraumatic, normocephalic, pupils equal and reactive to light, EOM's intact, TM's without erythema or bulging, some cloudy drainage in left ear- pt sts used OTC ear drops, no maxillary or frontal sinus tenderness , neck- painful with firm and very tender area along left side of neck behind ear that is warm to the touch - no small round palpable areas, oropharynx pink and dry without exudate Cardiovascular: elevated rate, regular rhythm, S1/S2 present.  No murmur or rub heard. No BLE edema. Pulmonary/Chest: Effort normal and breath sounds clear. No respiratory distress or retractions. Abdominal: Soft and non-tender, bowel sounds present x4 quadrants.  No CVA Tenderness  Psychiatric: Patient has a normal mood and affect. behavior is normal. Judgment and thought content normal.  Results for orders placed or performed in visit on 10/30/17 (from the past 72 hour(s))  POCT rapid strep A     Status: None   Collection Time: 10/30/17  9:12 AM  Result Value Ref Range   Rapid Strep A Screen Negative Negative    Assessment & Plan  1. Cervical lymphadenitis - Monitor for increase in swelling, fever/chills - amoxicillin-clavulanate (AUGMENTIN) 875-125 MG tablet; Take 1 tablet by mouth 2 (two) times daily for 10 days.  Dispense: 20 tablet; Refill: 0 - Ambulatory referral to ENT  2. Otalgia of both ears - Ambulatory referral to ENT  3. Viral upper respiratory tract infection - Rest, fluids, aleve PRN - benzonatate (TESSALON) 200 MG capsule; Take 1  capsule (200 mg total) by mouth 3 (three) times daily as needed for cough.  Dispense: 20 capsule; Refill: 0 - fluticasone (FLONASE) 50 MCG/ACT nasal spray; Place 2 sprays into both nostrils daily.  Dispense: 16 g; Refill: 6  4. Sore throat - Saline gargles, lozenges, hot tea - POCT rapid strep A - magic mouthwash w/lidocaine SOLN; Take 5 mLs by mouth 3 (three) times daily as needed for mouth pain.  Dispense: 100 mL; Refill: 0  5. Cough - benzonatate (TESSALON) 200 MG capsule; Take 1 capsule (200 mg total) by mouth 3 (three) times daily as needed for cough.  Dispense: 20 capsule; Refill: 0  -Red flags and when to present for emergency care or RTC including fever >101.10F, chest pain, shortness of breath, new/worsening/un-resolving symptoms,  reviewed with patient at time of visit. Follow up and care instructions discussed and provided in AVS. -Reviewed Health Maintenance:

## 2017-10-30 NOTE — Patient Instructions (Signed)
Take Aleve as needed for pain/inflammation/fever. Please also do saltwater gargles.

## 2017-11-26 ENCOUNTER — Ambulatory Visit: Payer: Self-pay | Admitting: *Deleted

## 2017-11-26 NOTE — Telephone Encounter (Signed)
  Reason for Disposition . [1] Continuous (nonstop) coughing interferes with work or school AND [2] no improvement using cough treatment per protocol  Answer Assessment - Initial Assessment Questions 1. ONSET: "When did the cough begin?"        Slight  Cough 3  Weeks  Ago  Worse  5  Days  Ago   2. SEVERITY: "How bad is the cough today?"       Every 7  mins   Worse  When she  Lays  Down   3. RESPIRATORY DISTRESS: "Describe your breathing."        Breathing is  Worse  When  She  Lays down   Otherwise  Ok   4. FEVER: "Do you have a fever?" If so, ask: "What is your temperature, how was it measured, and when did it start?"        No  Fever   5. HEMOPTYSIS: "Are you coughing up any blood?" If so ask: "How much?" (flecks, streaks, tablespoons, etc.)         No    6. TREATMENT: "What have you done so far to treat the cough?" (e.g., meds, fluids, humidifier)     Magic  Mouthwash  Tessalon pearls    Has  Been  Drinking  Water    7. CARDIAC HISTORY: "Do you have any history of heart disease?" (e.g., heart attack, congestive heart failure)          No   8. LUNG HISTORY: "Do you have any history of lung disease?"  (e.g., pulmonary embolus, asthma, emphysema)          no 9. PE RISK FACTORS: "Do you have a history of blood clots?" (or: recent major surgery, recent prolonged travel, bedridden )          No  10. OTHER SYMPTOMS: "Do you have any other symptoms? (e.g., runny nose, wheezing, chest pain)           NO 11. PREGNANCY: "Is there any chance you are pregnant?" "When was your last menstrual period?"            abstenence   1  Day late    7312. TRAVEL: "Have you traveled out of the country in the last month?" (e.g., travel history, exposures)           No  Protocols used: COUGH - ACUTE NON-PRODUCTIVE-A-AH

## 2017-11-26 NOTE — Telephone Encounter (Signed)
Pt  Reports  Symptoms  Of cough   Pretty much  Non  Productive  Has  Been taking  Tessalon  Pearls  Left  Over  From  Earlier   Visit  speaking in  Complete  Sentences   appt   Made  For  tomorrow  At  [facility]   Pt  Advised  To   Call  Back  If any  Distress   Or go to er  If  Worse

## 2017-11-27 ENCOUNTER — Encounter: Payer: Self-pay | Admitting: Nurse Practitioner

## 2017-11-27 ENCOUNTER — Ambulatory Visit (INDEPENDENT_AMBULATORY_CARE_PROVIDER_SITE_OTHER): Payer: BLUE CROSS/BLUE SHIELD | Admitting: Nurse Practitioner

## 2017-11-27 VITALS — BP 102/68 | HR 88 | Temp 98.1°F | Resp 16 | Ht 64.0 in | Wt 137.8 lb

## 2017-11-27 DIAGNOSIS — J069 Acute upper respiratory infection, unspecified: Secondary | ICD-10-CM

## 2017-11-27 DIAGNOSIS — R05 Cough: Secondary | ICD-10-CM | POA: Diagnosis not present

## 2017-11-27 DIAGNOSIS — R059 Cough, unspecified: Secondary | ICD-10-CM

## 2017-11-27 DIAGNOSIS — J029 Acute pharyngitis, unspecified: Secondary | ICD-10-CM | POA: Diagnosis not present

## 2017-11-27 MED ORDER — BENZONATATE 200 MG PO CAPS: 200.0000 mg | ORAL_CAPSULE | Freq: Three times a day (TID) | ORAL | 0 refills | Status: DC | PRN

## 2017-11-27 MED ORDER — MAGIC MOUTHWASH W/LIDOCAINE: 5.0000 mL | Freq: Three times a day (TID) | ORAL | 0 refills | Status: DC

## 2017-11-27 NOTE — Patient Instructions (Signed)
-   Tessalon pearl twice a day - Magic mouth wash as needed - Saline gargles - Honey lemon tea - Vitamin C  - tylenol and ibuprofen- for pain  - Drinking lots of water   You likely have a viral upper respiratory infection (URI). Antibiotics will not reduce the number of days you are ill or prevent you from getting bacterial rhinosinusitis. A URI can take up to 14 days. Your body is so smart and strong that it will be fighting this illness off for you but it is important that you drink plenty of fluids, rest. Cover your nose/mouth when you cough or sneeze and wash your hands well and often. Here are some helpful things you can use or pick up over the counter from the pharmacy to help with your symptoms:   For Fever/Pain: Acetaminophen every 6 hours as needed (maximum of 3000mg  a day). If you are still uncomfortable you can add ibuprofen OR naproxen  For coughing: try dextromethorphan for a cough suppressant, and/or a cool mist humidifier, lozenges  For sore throat: saline gargles, honey herbal tea, lozenges, throat spray  To dry out your nose: try an antihistamine like loratadine (non-sedating) or diphenhydramine (sedating) or others To relieve a stuffy nose: try an oral decongestant  Like pseudoephedrine if you are under the age of 44 and do not have high blood pressure, neti pot To make blowing your nose easier: guaifenesin

## 2017-11-27 NOTE — Progress Notes (Addendum)
Name: Krystal Crane   MRN: [id number]    DOB: [birth year]   Date:11/27/2017       Progress Note  Subjective  Chief Complaint  Chief Complaint  Patient presents with  . Cough    for 6 days    HPI  Patient presents to clinic with illness ongoing for 10 days. First symptom was sore throat then cough. Onset was progressive. Cough: dry, strong, worst at night but goes on through the whole day. Endorses voice change, pain in ribs and back with coughing. Denies Ear pain, discharge, muffled sounds, Nasal congestion, nasal drainage, facial pain, dysphagia, shortness of breath, stridor, fever, myalgias, malaise, headache. Has tried tessalon pearls and magic mouthwash- helped sore throat, and pearls helped cough but ran out.   Patient Active Problem List   Diagnosis Date Noted  . Vitamin B12 deficiency 10/23/2017  . Hypotension 07/26/2017  . Supraventricular tachycardia (HCC) 07/19/2017  . Syncopal episodes 07/19/2017  . Hypocalcemia 07/19/2017  . Anemia 07/19/2017  . Adnexal mass 11/28/2016  . History of kidney stones 11/19/2016  . Medication monitoring encounter 10/18/2016  . Weight loss 10/18/2016  . Vitamin D deficiency 05/13/2016  . Overweight (BMI 25.0-29.9) 05/13/2016  . Recurrent urinary tract infection 12/09/2015  . 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 Benzodiazepine dependence, continuous (HCC) 05/10/2015  . Major depressive disorder, recurrent episode, in partial remission with anxious distress (HCC) 04/20/2015  . Cholelithiasis without obstruction 04/20/2015  . HLD (hyperlipidemia) 04/20/2015  . 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 Cerebral vascular accident (HCC) 04/20/2015  . Headache, tension-type 04/20/2015  . Basilar artery stenosis 04/20/2015  . Nodule of finger of left hand 04/20/2015  . Calculus of gallbladder 01/25/2014  . 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 Cephalalgia 04/21/2012  . Diffuse cerebrovascular disease 02/28/2011     Past Medical History:  Diagnosis Date  . Adnexal mass 11/28/2016   5.5 cm RIGHT adnexal mass; pain on left; refer to GYN  . Anxiety   . 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 Depression   . Hyperlipidemia   . Moyamoya disease   . Recurrent urinary tract infection 12/09/2015  . Stroke (cerebrum) (HCC) 2012   Both MCA arteries blocked    Past Surgical History:  Procedure Laterality Date  . BARIATRIC SURGERY  12/30/2012  . BRAIN SURGERY  03/2011   Cerebral bypass surgery  . CESAREAN SECTION  1998, 2004, 2009   3  . CHOLECYSTECTOMY  2015   [facility]    Social History   Tobacco Use  . Smoking status: Never Smoker  . Smokeless tobacco: Never Used  Substance Use Topics  . Alcohol use: No    Alcohol/week: 0.0 oz    Frequency: Never    Comment: social     Current Outpatient Medications:  .  aspirin EC 325 MG tablet, Take 325 mg by mouth., Disp: , Rfl:  .  ergocalciferol (VITAMIN D2) 50000 units capsule, Take 50,000 Units by mouth once a week., Disp: , Rfl:  .  escitalopram (LEXAPRO) 20 MG tablet, Take 20 mg by mouth daily., Disp: , Rfl:  .  fluticasone (FLONASE) 50 MCG/ACT nasal spray, Place 2 sprays into both nostrils daily., Disp: 16 g, Rfl: 6 .  pregabalin (LYRICA) 300 MG capsule, Take by mouth., Disp: , Rfl:  .  QUDEXY XR 200 MG CS24, Take 1 capsule by mouth at bedtime., Disp: , Rfl: 3 .  benzonatate (TESSALON) 200 MG capsule, Take 1 capsule (  200 mg total) by mouth 3 (three) times daily as needed for cough. (Patient not taking: Reported on 11/27/2017), Disp: 20 capsule, Rfl: 0 .  [place] 50 MG PACK, 1 (ONE) PACKET TWO TIMES DAILY, AS NEEDED, Disp: , Rfl: 11 .  cholecalciferol (VITAMIN D) 1000 units tablet, Take 1,000 Units by mouth daily., Disp: , Rfl:  .  Erenumab-aooe (AIMOVIG 140 DOSE) 70 MG/ML SOAJ, Inject into the skin., Disp: , Rfl:  .  magic mouthwash w/lidocaine SOLN, Take 5 mLs by mouth 3 (three) times daily as needed for mouth pain. (Patient not taking:  Reported on 11/27/2017), Disp: 100 mL, Rfl: 0  Allergies  Allergen Reactions  . Levetiracetam Hives  . Ondansetron Nausea And Vomiting    ROS  No other specific complaints in a complete review of systems (except as listed in HPI above).  Objective  Vitals:   11/27/17 0848  BP: 102/68  Pulse: 88  Resp: 16  Temp: 98.1 F (36.7 C)  TempSrc: Oral  SpO2: 95%  Weight: 137 lb 12.8 oz (62.5 kg)  Height: 5\' 4"  (1.626 m)    Body mass index is 23.65 kg/m.  Nursing Note and Vital Signs reviewed.  Physical Exam  Constitutional: Patient appears well-developed and well-nourished. No distress.  HEENT: head atraumatic, normocephalic, pupils equal and reactive to light, TM's without erythema or bulging,  no maxillary or frontal sinus tenderness , neck supple without lymphadenopathy, oropharynx irritated, no edema moist without exudate, no nasal discharge Cardiovascular: Normal rate, regular rhythm, S1/S2 present.  No murmur or rub heard.  Pulmonary/Chest: Effort normal and breath sounds clear. No respiratory distress or retractions. Abdominal: Soft and non-tender, bowel sounds present  Psychiatric: Patient has a normal mood and affect. behavior is normal. Judgment and thought content normal.  No results found for this or any previous visit (from the past 72 hour(s)).  Assessment & Plan  Patient presents today for URI symptoms have been ongoing for over 1 week.  Symptoms are progressive started with a sore throat which has decreased now then persistent dry cough that is continued and is worsening and preventing her from sleep.  Patient had leftover Tessalon Perles and Magic mouthwash from the last URI like illness in March.  Patient self treated with these and states relieved symptoms but has run out.  No fever, throat is mildly irritated, lungs are clear-does not appear bacterial.  Discussed with patient OTC viral URI management.  Discussed if new or persistent symptoms may need  reevaluation. 1. Cough - cool mist humidifier, lozenges, honey lemon tea - benzonatate (TESSALON) 200 MG capsule; Take 1 capsule (200 mg total) by mouth 3 (three) times daily as needed for cough.  Dispense: 30 capsule; Refill: 0  2. Viral upper respiratory tract infection - discussed OTC management  - benzonatate (TESSALON) 200 MG capsule; Take 1 capsule (200 mg total) by mouth 3 (three) times daily as needed for cough.  Dispense: 30 capsule; Refill: 0  3. Sore throat - magic mouthwash w/lidocaine SOLN; Take 5 mLs by mouth 3 (three) times daily.  Dispense: 100 mL; Refill: 0    -Red flags and when to present for emergency care or RTC including fever >101.27F, chest pain, shortness of breath, new/worsening/un-resolving symptoms, reviewed with patient at time of visit. Follow up and care instructions discussed and provided in AVS.  -------------------------- I have reviewed this encounter including the documentation in this note and/or discussed this patient with the provider, Sharyon Cable DNP AGNP-C. I am certifying that I  agree with the content of this note as supervising physician. Baruch GoutyMelinda Lada, MD [facility] 12/31/2017, 12:25 PM

## 2018-04-22 ENCOUNTER — Other Ambulatory Visit: Payer: Self-pay | Admitting: Obstetrics and Gynecology

## 2018-04-22 ENCOUNTER — Telehealth: Payer: Self-pay

## 2018-04-22 MED ORDER — METRONIDAZOLE 500 MG PO TABS: 500.0000 mg | ORAL_TABLET | Freq: Two times a day (BID) | ORAL | 0 refills | Status: AC

## 2018-04-22 NOTE — Telephone Encounter (Signed)
Pt called triage line stating she has an appointment on 9/18, but is experience BV S&S. Very bad odor (even through her clothes) and discharge. AMS has sent this in before for her. She doesn't think she should wait until her appointment due to it being bad. [facility] [address]   AMS I did not see this on her medication list to refill for her. Please advise

## 2018-04-22 NOTE — Telephone Encounter (Signed)
I called it in if that doesn't clear it up we'll take a look next week
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
## 2018-04-30 ENCOUNTER — Ambulatory Visit (INDEPENDENT_AMBULATORY_CARE_PROVIDER_SITE_OTHER): Payer: Medicare Other | Admitting: Obstetrics and Gynecology

## 2018-04-30 ENCOUNTER — Other Ambulatory Visit (HOSPITAL_COMMUNITY)
Admission: RE | Admit: 2018-04-30 | Discharge: 2018-04-30 | Disposition: A | Discharge status: Home / Self Care | Payer: Medicare Other | Source: Ambulatory Visit | Attending: Obstetrics and Gynecology | Admitting: Obstetrics and Gynecology

## 2018-04-30 ENCOUNTER — Encounter: Payer: Self-pay | Admitting: Obstetrics and Gynecology

## 2018-04-30 VITALS — BP 102/60 | HR 79 | Ht 64.0 in | Wt 132.0 lb

## 2018-04-30 DIAGNOSIS — Z01419 Encounter for gynecological examination (general) (routine) without abnormal findings: Secondary | ICD-10-CM

## 2018-04-30 DIAGNOSIS — Z113 Encounter for screening for infections with a predominantly sexual mode of transmission: Secondary | ICD-10-CM | POA: Insufficient documentation

## 2018-04-30 DIAGNOSIS — Z30431 Encounter for routine checking of intrauterine contraceptive device: Secondary | ICD-10-CM

## 2018-04-30 DIAGNOSIS — Z1239 Encounter for other screening for malignant neoplasm of breast: Secondary | ICD-10-CM

## 2018-04-30 MED ORDER — SECNIDAZOLE 2 G PO PACK: 2.0000 g | PACK | Freq: Once | ORAL | 0 refills | Status: AC

## 2018-04-30 MED ORDER — CLINDAMYCIN PHOSPHATE 100 MG VA SUPP: 100.0000 mg | Freq: Every day | VAGINAL | 1 refills | Status: AC

## 2018-04-30 NOTE — Patient Instructions (Signed)
[facility] [address] [place] [address]  [facility] [place]  [address] [place] [address]  Phone: [phone]  

## 2018-04-30 NOTE — Progress Notes (Signed)
Gynecology Annual Exam  PCP: Kerman Passey, MD  Chief Complaint:  Chief Complaint  Patient presents with  . Gynecologic Exam    STD check    History of Present Illness: Patient is a 42 y.o. [id number] presents for annual exam. The patient has no complaints today.   LMP: Patient's last menstrual period was 04/07/2018 (exact date). Average Interval: regular, 28 days Duration of flow: 5 days Heavy Menses: no Clots: no Intermenstrual Bleeding: no Postcoital Bleeding: no Dysmenorrhea: no   The patient is sexually active. She currently uses IUD for contraception. She denies dyspareunia.  The patient does perform self breast exams.  There is no notable family history of breast or ovarian cancer in her family.  The patient wears seatbelts: yes.   The patient has regular exercise: not asked.    The patient denies current symptoms of depression.    Review of Systems: Review of Systems  Constitutional: Negative for chills and fever.  HENT: Negative for congestion.   Respiratory: Negative for cough and shortness of breath.   Cardiovascular: Negative for chest pain and palpitations.  Gastrointestinal: Negative for abdominal pain, constipation, diarrhea, heartburn, nausea and vomiting.  Genitourinary: Negative for dysuria, frequency and urgency.  Skin: Negative for itching and rash.  Neurological: Negative for dizziness and headaches.  Endo/Heme/Allergies: Negative for polydipsia.  Psychiatric/Behavioral: Negative for depression.    Past Medical History:  Past Medical History:  Diagnosis Date  . Adnexal mass 11/28/2016   5.5 cm RIGHT adnexal mass; pain on left; refer to GYN  . Anxiety   . 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 Depression   . Hyperlipidemia   . Moyamoya disease   . Recurrent urinary tract infection 12/09/2015  . 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  BARIATRIC SURGERY  12/30/2012  . BRAIN SURGERY  03/2011   Cerebral bypass surgery  . CESAREAN SECTION  1998, 2004, 2009   3  . CHOLECYSTECTOMY  2015   [facility]    Gynecologic History:  Patient's last menstrual period was 04/07/2018 (exact date). Contraception: IUD Paraguard 01/05/2011 Last Pap: Results were: 04/20/16 NIL and HR HPV negative  04/07/14 NIL HPV positive 01/05/2011 NIL 11.09/2015 NIL  Last mammogram: 2016 BI-RAD I   Obstetric History: [id number]  Family History:  Family History  Problem Relation Age of Onset  . Hypertension Mother   . Stroke Mother   . Liver disease Mother   . Hyperlipidemia Father   . Hypertension Father   . Stroke Maternal Grandmother   . Cancer Paternal Grandfather     Social History:  Social History   Socioeconomic History  . Marital status: Legally Separated    Spouse name: Not on file  . Number of children: Not on file  . Years of education: Not on file  . Highest education level: Not on file  Occupational History  . Not on file  Social Needs  . Financial resource strain: Not on file  . Food insecurity:    Worry: Not on file    Inability: Not on file  . Transportation needs:    Medical: Not on file    Non-medical: Not on file  Tobacco Use  . Smoking status: Never Smoker  . Smokeless tobacco: Never Used  Substance and Sexual Activity  . Alcohol use: No    Alcohol/week: 0.0 standard drinks  Frequency: Never  . Drug use: No  . Sexual activity: Yes    Partners: Male    Birth control/protection: IUD  Lifestyle  . Physical activity:    Days per week: Not on file    Minutes per session: Not on file  . Stress: Not on file  Relationships  . Social connections:    Talks on phone: Not on file    Gets together: Not on file    Attends religious service: Not on file    Active member of club or organization: Not on file    Attends meetings of clubs or organizations: Not on file    Relationship status: Not on file  . Intimate  partner violence:    Fear of current or ex partner: Not on file    Emotionally abused: Not on file    Physically abused: Not on file    Forced sexual activity: Not on file  Other Topics Concern  . Not on file  Social History Narrative  . Not on file    Allergies:  Allergies  Allergen Reactions  . Levetiracetam Hives  . Ondansetron Nausea And Vomiting    Medications: Prior to Admission medications   Medication Sig Start Date End Date Taking? Authorizing Provider  aspirin EC 325 MG tablet Take 325 mg by mouth. 12/14/11  Yes [provider]  CAMBIA 50 MG PACK 1 (ONE) PACKET TWO TIMES DAILY, AS NEEDED 10/28/17  Yes [provider]  Erenumab-aooe (AIMOVIG 140 DOSE) 70 MG/ML SOAJ Inject into the skin.   Yes [provider]  escitalopram (LEXAPRO) 20 MG tablet Take 20 mg by mouth daily.   Yes [provider]  fluticasone (FLONASE) 50 MCG/ACT nasal spray Place 2 sprays into both nostrils daily. 10/30/17  Yes Doren Custard, FNP  pregabalin (LYRICA) 300 MG capsule Take by mouth. 12/17/16  Yes [provider]  QUDEXY XR 200 MG CS24 Take 1 capsule by mouth at bedtime. 09/08/15  Yes [provider]    Physical Exam Vitals: Blood pressure 102/60, pulse 79, height 5\' 4"  (1.626 m), weight 132 lb (59.9 kg), last menstrual period 04/07/2018.  General: NAD HEENT: normocephalic, anicteric Thyroid: no enlargement, no palpable nodules Pulmonary: No increased work of breathing, CTAB Cardiovascular: RRR, distal pulses 2+ Breast: Breast symmetrical, no tenderness, no palpable nodules or masses, no skin or nipple retraction present, no nipple discharge.  No axillary or supraclavicular lymphadenopathy. Abdomen: NABS, soft, non-tender, non-distended.  Umbilicus without lesions.  No hepatomegaly, splenomegaly or masses palpable. No evidence of hernia  Genitourinary:  External: Normal external female genitalia.  Normal urethral meatus, normal Bartholin's and  Skene's glands.    Vagina: Normal vaginal mucosa, no evidence of prolapse.    Cervix: Grossly normal in appearance, no bleeding  Uterus: Non-enlarged, mobile, normal contour.  No CMT  Adnexa: ovaries non-enlarged, no adnexal masses  Rectal: deferred  Lymphatic: no evidence of inguinal lymphadenopathy Extremities: no edema, erythema, or tenderness Neurologic: Grossly intact Psychiatric: mood appropriate, affect full  Female chaperone present for pelvic and breast  portions of the physical exam    Assessment: 42 y.o. [id number] routine annual exam, recurrent BV  Plan: Problem List Items Addressed This Visit    None    Visit Diagnoses    Encounter for gynecological examination without abnormal finding    -  Primary   Relevant Orders   HEP, RPR, HIV Panel   Cervicovaginal ancillary only   Breast screening  Relevant Orders   MM 3D SCREEN BREAST BILATERAL   IUD check up       Routine screening for STI (sexually transmitted infection)       Relevant Orders   HEP, RPR, HIV Panel   Cervicovaginal ancillary only      1) Mammogram - recommend yearly screening mammogram.  Mammogram Was ordered today   2) STI screening  wasoffered and accepted  3) ASCCP guidelines and rational discussed.  Patient opts for every 3 years screening interval  4) Contraception - the patient is currently using  IUD.  She is happy with her current form of contraception and plans to continue  5) Colonoscopy -- Screening recommended starting at age 44 for average risk individuals, age 44 for individuals deemed at increased risk (including African Americans) and recommended to continue until age 44.  For patient age 44-85 individualized approach is recommended.  Gold standard screening is via colonoscopy, Cologuard screening is an acceptable alternative for patient unwilling or unable to undergo colonoscopy.  "Colorectal cancer screening for average?risk adults: 2018 guideline update from the American Cancer  Society"CA: A Cancer Journal for Clinicians: Jan 09, 2017   6) Routine healthcare maintenance including cholesterol, diabetes screening discussed managed by PCP  7) Recurrent BV - solosec and clindamycin vaginal suppositories.  Recommend vaginal probiotic  8)  Return in about 1 year (around 05/01/2019) for annual.   Vena AustriaAndreas Saige Canton, MD, Merlinda FrederickFACOG [facility], [facility] 04/30/2018, 10:21 AM

## 2018-05-01 LAB — HEP, RPR, HIV PANEL
HEP B S AG: NEGATIVE
HIV Screen 4th Generation wRfx: NONREACTIVE
RPR: NONREACTIVE

## 2018-05-01 LAB — CERVICOVAGINAL ANCILLARY ONLY
Chlamydia: NEGATIVE
Neisseria Gonorrhea: NEGATIVE
Trichomonas: NEGATIVE

## 2018-05-13 DEATH — deceased
